# Patient Record
Sex: Female | Born: 1935 | Race: White | Hispanic: No | Marital: Married | Smoking: Never smoker
Health system: Southern US, Community
[De-identification: ages and names within clinical notes are randomized; demographics above are authoritative.]

## PROBLEM LIST (undated history)

## (undated) DIAGNOSIS — M1990 Unspecified osteoarthritis, unspecified site: Secondary | ICD-10-CM

## (undated) DIAGNOSIS — H269 Unspecified cataract: Secondary | ICD-10-CM

## (undated) DIAGNOSIS — G709 Myoneural disorder, unspecified: Secondary | ICD-10-CM

## (undated) DIAGNOSIS — Z5189 Encounter for other specified aftercare: Secondary | ICD-10-CM

## (undated) DIAGNOSIS — I10 Essential (primary) hypertension: Secondary | ICD-10-CM

## (undated) HISTORY — PX: CATARACT EXTRACTION: SUR2

## (undated) HISTORY — DX: Myoneural disorder, unspecified: G70.9

## (undated) HISTORY — PX: ABDOMINAL HYSTERECTOMY: SHX81

## (undated) HISTORY — DX: Essential (primary) hypertension: I10

## (undated) HISTORY — DX: Unspecified cataract: H26.9

## (undated) HISTORY — DX: Encounter for other specified aftercare: Z51.89

## (undated) HISTORY — PX: APPENDECTOMY: SHX54

## (undated) HISTORY — DX: Unspecified osteoarthritis, unspecified site: M19.90

## (undated) HISTORY — PX: EYE SURGERY: SHX253

## (undated) HISTORY — PX: JOINT REPLACEMENT: SHX530

---

## 1960-12-24 HISTORY — PX: OTHER SURGICAL HISTORY: SHX169

## 1988-12-24 HISTORY — PX: VAGINAL HYSTERECTOMY: SHX2639

## 2004-11-28 ENCOUNTER — Ambulatory Visit (HOSPITAL_COMMUNITY): Admission: RE | Admit: 2004-11-28 | Discharge: 2004-11-28 | Payer: Self-pay | Admitting: Family Medicine

## 2004-12-05 ENCOUNTER — Ambulatory Visit (HOSPITAL_COMMUNITY): Admission: RE | Admit: 2004-12-05 | Discharge: 2004-12-05 | Payer: Self-pay | Admitting: Internal Medicine

## 2004-12-05 ENCOUNTER — Ambulatory Visit: Payer: Self-pay | Admitting: Internal Medicine

## 2005-04-06 ENCOUNTER — Ambulatory Visit (HOSPITAL_COMMUNITY): Admission: RE | Admit: 2005-04-06 | Discharge: 2005-04-06 | Payer: Self-pay | Admitting: Family Medicine

## 2008-03-02 ENCOUNTER — Ambulatory Visit (HOSPITAL_COMMUNITY): Admission: RE | Admit: 2008-03-02 | Discharge: 2008-03-02 | Payer: Self-pay | Admitting: Family Medicine

## 2008-03-18 ENCOUNTER — Ambulatory Visit (HOSPITAL_COMMUNITY): Admission: RE | Admit: 2008-03-18 | Discharge: 2008-03-18 | Payer: Self-pay | Admitting: Family Medicine

## 2008-04-12 ENCOUNTER — Ambulatory Visit (HOSPITAL_COMMUNITY): Admission: RE | Admit: 2008-04-12 | Discharge: 2008-04-12 | Payer: Self-pay | Admitting: Family Medicine

## 2008-09-10 ENCOUNTER — Ambulatory Visit (HOSPITAL_COMMUNITY): Admission: RE | Admit: 2008-09-10 | Discharge: 2008-09-10 | Payer: Self-pay | Admitting: Family Medicine

## 2010-05-24 HISTORY — PX: REPLACEMENT TOTAL KNEE: SUR1224

## 2010-06-22 ENCOUNTER — Inpatient Hospital Stay (HOSPITAL_COMMUNITY): Admission: RE | Admit: 2010-06-22 | Discharge: 2010-06-25 | Payer: Self-pay | Admitting: Orthopedic Surgery

## 2011-01-24 HISTORY — PX: BASAL CELL CARCINOMA EXCISION: SHX1214

## 2011-03-11 LAB — CROSSMATCH
ABO/RH(D): O POS
Antibody Screen: NEGATIVE

## 2011-03-11 LAB — CBC
HCT: 20.9 % — ABNORMAL LOW (ref 36.0–46.0)
HCT: 23.6 % — ABNORMAL LOW (ref 36.0–46.0)
HCT: 27.8 % — ABNORMAL LOW (ref 36.0–46.0)
HCT: 37.3 % (ref 36.0–46.0)
Hemoglobin: 7.2 g/dL — ABNORMAL LOW (ref 12.0–15.0)
Hemoglobin: 8.1 g/dL — ABNORMAL LOW (ref 12.0–15.0)
Hemoglobin: 9.4 g/dL — ABNORMAL LOW (ref 12.0–15.0)
Hemoglobin: 12.2 g/dL (ref 12.0–15.0)
MCH: 32.1 pg (ref 26.0–34.0)
MCH: 32.2 pg (ref 26.0–34.0)
MCH: 32.2 pg (ref 26.0–34.0)
MCH: 31.3 pg (ref 26.0–34.0)
MCHC: 33.9 g/dL (ref 30.0–36.0)
MCHC: 34.2 g/dL (ref 30.0–36.0)
MCHC: 34.4 g/dL (ref 30.0–36.0)
MCHC: 32.7 g/dL (ref 30.0–36.0)
MCV: 93.7 fL (ref 78.0–100.0)
MCV: 93.9 fL (ref 78.0–100.0)
MCV: 95 fL (ref 78.0–100.0)
MCV: 95.8 fL (ref 78.0–100.0)
Platelets: 198 10*3/uL (ref 150–400)
Platelets: 202 10*3/uL (ref 150–400)
Platelets: 240 10*3/uL (ref 150–400)
Platelets: 280 10*3/uL (ref 150–400)
RBC: 2.23 MIL/uL — ABNORMAL LOW (ref 3.87–5.11)
RBC: 2.51 MIL/uL — ABNORMAL LOW (ref 3.87–5.11)
RBC: 2.93 MIL/uL — ABNORMAL LOW (ref 3.87–5.11)
RBC: 3.89 MIL/uL (ref 3.87–5.11)
RDW: 13.1 % (ref 11.5–15.5)
RDW: 13.3 % (ref 11.5–15.5)
RDW: 13.5 % (ref 11.5–15.5)
RDW: 13.4 % (ref 11.5–15.5)
WBC: 6.3 10*3/uL (ref 4.0–10.5)
WBC: 7.6 10*3/uL (ref 4.0–10.5)
WBC: 8 10*3/uL (ref 4.0–10.5)
WBC: 5.4 10*3/uL (ref 4.0–10.5)

## 2011-03-11 LAB — URINALYSIS, ROUTINE W REFLEX MICROSCOPIC
Bilirubin Urine: NEGATIVE
Glucose, UA: NEGATIVE mg/dL
Hgb urine dipstick: NEGATIVE
Ketones, ur: NEGATIVE mg/dL
Nitrite: NEGATIVE
Protein, ur: NEGATIVE mg/dL
Specific Gravity, Urine: 1.016 (ref 1.005–1.030)
Urobilinogen, UA: 0.2 mg/dL (ref 0.0–1.0)
pH: 7 (ref 5.0–8.0)

## 2011-03-11 LAB — COMPREHENSIVE METABOLIC PANEL
ALT: 22 U/L (ref 0–35)
AST: 26 U/L (ref 0–37)
Albumin: 4.1 g/dL (ref 3.5–5.2)
Alkaline Phosphatase: 52 U/L (ref 39–117)
BUN: 18 mg/dL (ref 6–23)
CO2: 29 mEq/L (ref 19–32)
Calcium: 9.7 mg/dL (ref 8.4–10.5)
Chloride: 107 mEq/L (ref 96–112)
Creatinine, Ser: 0.88 mg/dL (ref 0.4–1.2)
GFR calc Af Amer: >60 mL/min (ref >60)
GFR calc non Af Amer: >60 mL/min (ref >60)
Glucose, Bld: 81 mg/dL (ref 70–99)
Potassium: 4.2 mEq/L (ref 3.5–5.1)
Sodium: 143 mEq/L (ref 135–145)
Total Bilirubin: 0.7 mg/dL (ref 0.3–1.2)
Total Protein: 7.4 g/dL (ref 6.0–8.3)

## 2011-03-11 LAB — BASIC METABOLIC PANEL
BUN: 15 mg/dL (ref 6–23)
BUN: 18 mg/dL (ref 6–23)
CO2: 28 mEq/L (ref 19–32)
CO2: 29 mEq/L (ref 19–32)
Calcium: 8.5 mg/dL (ref 8.4–10.5)
Calcium: 8.8 mg/dL (ref 8.4–10.5)
Chloride: 103 mEq/L (ref 96–112)
Chloride: 105 mEq/L (ref 96–112)
Creatinine, Ser: 0.77 mg/dL (ref 0.4–1.2)
Creatinine, Ser: 0.79 mg/dL (ref 0.4–1.2)
GFR calc Af Amer: >60 mL/min (ref >60)
GFR calc Af Amer: >60 mL/min (ref >60)
GFR calc non Af Amer: >60 mL/min (ref >60)
GFR calc non Af Amer: >60 mL/min (ref >60)
Glucose, Bld: 105 mg/dL — ABNORMAL HIGH (ref 70–99)
Glucose, Bld: 150 mg/dL — ABNORMAL HIGH (ref 70–99)
Potassium: 3.4 mEq/L — ABNORMAL LOW (ref 3.5–5.1)
Potassium: 4.6 mEq/L (ref 3.5–5.1)
Sodium: 137 mEq/L (ref 135–145)
Sodium: 137 mEq/L (ref 135–145)

## 2011-03-11 LAB — SURGICAL PCR SCREEN
MRSA, PCR: NEGATIVE
Staphylococcus aureus: NEGATIVE

## 2011-03-11 LAB — APTT: aPTT: 28 seconds (ref 24–37)

## 2011-03-11 LAB — DIFFERENTIAL
Basophils Absolute: 0 10*3/uL (ref 0.0–0.1)
Basophils Relative: 1 % (ref 0–1)
Eosinophils Absolute: 0.1 10*3/uL (ref 0.0–0.7)
Eosinophils Relative: 3 % (ref 0–5)
Lymphocytes Relative: 34 % (ref 12–46)
Lymphs Abs: 1.8 10*3/uL (ref 0.7–4.0)
Monocytes Absolute: 0.3 10*3/uL (ref 0.1–1.0)
Monocytes Relative: 6 % (ref 3–12)
Neutro Abs: 3.1 10*3/uL (ref 1.7–7.7)
Neutrophils Relative %: 57 % (ref 43–77)

## 2011-03-11 LAB — PROTIME-INR
INR: 1.02 (ref 0.00–1.49)
Prothrombin Time: 13.3 seconds (ref 11.6–15.2)

## 2011-03-11 LAB — ABO/RH: ABO/RH(D): O POS

## 2011-05-11 NOTE — Op Note (Signed)
Kristi Grant, Kristi Grant                  ACCOUNT NO.:  [id number]   MEDICAL RECORD NO.:  [id number]          PATIENT TYPE:  AMB   LOCATION:  DAY                           FACILITY:  [facility]   PHYSICIAN:  R. Roetta Sessions, M.D. DATE OF BIRTH:  09-04-1934   DATE OF PROCEDURE:  12/05/2004  DATE OF DISCHARGE:                                 OPERATIVE REPORT   PROCEDURE:  Screening colonoscopy.   INDICATION FOR PROCEDURE:  The patient is a 77 year old lady referred out of  courtesy of Dr. Gerda Diss for colorectal cancer screening.  She had a flexible  sigmoidoscopy which was negative five years ago.  She is devoid of any lower  GI tract symptoms.  There is no family history of colorectal neoplasia.  Colonoscopy is now being done as a standard screening maneuver.  This  approach has been discussed with the patient at length.  Potential risks,  benefits, and alternatives have been reviewed, questions answered, she is  agreeable.  Please see documentation in the medical record.   PROCEDURE NOTE:  O2 saturation, blood pressure, pulse, and respirations were  monitored throughout the entire procedure.   CONSCIOUS SEDATION:  Versed 4 mg IV, Demerol 75 mg IV in divided doses.   INSTRUMENT USED:  Olympus video chip system.   FINDINGS:  Digital exam revealed no abnormalities.  Endoscopic findings:  Prep was good.   Rectum:  Examination of the rectal mucosa, including retroflexed view of the  anal verge, revealed no abnormalities.   Colon:  The colonic mucosa was surveyed from the rectosigmoid junction  through the left, transverse, and right colon to the area of the appendiceal  orifice, ileocecal valve, and cecum.  These structures were well-seen and  photographed for the record.  From this level the scope was slowly withdrawn  and all previously-mentioned mucosal surfaces were again seen.  The  patient's colon was quite long and tortuous, requiring a number of  maneuvers, including changing of the  patient's position and external  abdominal pressure to reach the cecum.  The colonic mucosa, however,  appeared entirely normal.  The patient tolerated the procedure well and was  reacted in endoscopy.   IMPRESSION:  1.  Normal rectum.  2.  Normal colon.   RECOMMENDATIONS:  Repeat colonoscopy 10 years.     Otelia Sergeant   RMR/MEDQ  D:  12/05/2004  T:  12/05/2004  Job:  [id number]   cc:   Dr. Gerda Diss

## 2013-06-25 ENCOUNTER — Ambulatory Visit (INDEPENDENT_AMBULATORY_CARE_PROVIDER_SITE_OTHER): Payer: 59 | Admitting: Nurse Practitioner

## 2013-06-25 ENCOUNTER — Encounter: Payer: Self-pay | Admitting: Nurse Practitioner

## 2013-06-25 VITALS — BP 120/70 | Temp 98.3°F | Wt 196.5 lb

## 2013-06-25 DIAGNOSIS — N390 Urinary tract infection, site not specified: Secondary | ICD-10-CM

## 2013-06-25 LAB — POCT UA - MICROSCOPIC ONLY

## 2013-06-25 LAB — POCT URINALYSIS DIPSTICK: Spec Grav, UA: <=1.005

## 2013-06-25 MED ORDER — CEFUROXIME AXETIL 500 MG PO TABS: 500.0000 mg | ORAL_TABLET | Freq: Two times a day (BID) | ORAL | Status: DC

## 2013-06-28 ENCOUNTER — Encounter: Payer: Self-pay | Admitting: Nurse Practitioner

## 2013-06-28 DIAGNOSIS — N390 Urinary tract infection, site not specified: Secondary | ICD-10-CM | POA: Insufficient documentation

## 2013-06-28 NOTE — Assessment & Plan Note (Signed)
Ceftin 500 mg 1 by mouth twice a day x7 days. Continue AZO for 48 hours then discontinue. Warning signs reviewed. Call back if symptoms worsen or persist.

## 2013-06-28 NOTE — Progress Notes (Signed)
Subjective:  Presents complaints of urinary symptoms for the past 4 days. Low-grade fever. Mild lower, pain. Bladder spasms of the end of urination. Urgency and frequency. Over 2 her urine. No nausea vomiting. No back pain. Taking fluids well. No history of recent UTI. Married, same sexual partner. No vaginal discharge.  Objective:   BP 120/70  Temp(Src) 98.3 F (36.8 C) (Oral)  Wt 196 lb 8 oz (89.132 kg) NAD. Alert, oriented. Lungs clear. Heart regular rate rhythm. No CVA area tenderness. Abdomen soft nondistended with mild suprapubic area discomfort to palpation. Urine microscopic 5-10 WBCs, rare RBC and few bacteria.  Assessment:UTI (urinary tract infection) - Plan: POCT urinalysis dipstick, POCT UA - Microscopic Only  Plan: Ceftin 500 mg 1 by mouth twice a day x7 days. Continue AZO for 48 hours then discontinue. Warning signs reviewed. Call back if symptoms worsen or persist.

## 2013-09-01 ENCOUNTER — Encounter: Payer: Self-pay | Admitting: Family Medicine

## 2013-09-01 ENCOUNTER — Ambulatory Visit (INDEPENDENT_AMBULATORY_CARE_PROVIDER_SITE_OTHER): Payer: 59 | Admitting: Family Medicine

## 2013-09-01 VITALS — BP 130/70 | Temp 98.3°F | Ht 66.75 in | Wt 201.0 lb

## 2013-09-01 DIAGNOSIS — E785 Hyperlipidemia, unspecified: Secondary | ICD-10-CM

## 2013-09-01 DIAGNOSIS — I10 Essential (primary) hypertension: Secondary | ICD-10-CM

## 2013-09-01 DIAGNOSIS — J209 Acute bronchitis, unspecified: Secondary | ICD-10-CM

## 2013-09-01 MED ORDER — ALBUTEROL SULFATE HFA 108 (90 BASE) MCG/ACT IN AERS: 2.0000 | INHALATION_SPRAY | Freq: Four times a day (QID) | RESPIRATORY_TRACT | Status: DC | PRN

## 2013-09-01 MED ORDER — AMOXICILLIN 500 MG PO TABS: 500.0000 mg | ORAL_TABLET | Freq: Three times a day (TID) | ORAL | Status: AC

## 2013-09-01 NOTE — Progress Notes (Signed)
  Subjective:    Patient ID: Kristi Grant, female    DOB: 1934-07-19, 79 y.o.   MRN: [id number]  HPI  Patient arrives for cough and head congestion for over a week.  Review of Systems  Constitutional: Negative for fever and activity change.  HENT: Positive for congestion and rhinorrhea. Negative for ear pain.   Eyes: Negative for discharge.  Respiratory: Positive for cough. Negative for shortness of breath and wheezing.   Cardiovascular: Negative for chest pain.       Objective:   Physical Exam  Nursing note and vitals reviewed. Constitutional: She appears well-developed.  HENT:  Head: Normocephalic.  Nose: Nose normal.  Mouth/Throat: Oropharynx is clear and moist. No oropharyngeal exudate.  Neck: Neck supple.  Cardiovascular: Normal rate and normal heart sounds.   No murmur heard. Pulmonary/Chest: Effort normal and breath sounds normal. She has no wheezes.  Lymphadenopathy:    She has no cervical adenopathy.  Skin: Skin is warm and dry.          Assessment & Plan:  Acute bronchitis Acute sinusitis Rx sent in F/u if problems

## 2013-11-12 ENCOUNTER — Encounter: Payer: Self-pay | Admitting: Family Medicine

## 2013-12-09 ENCOUNTER — Telehealth: Payer: Self-pay | Admitting: Family Medicine

## 2013-12-09 DIAGNOSIS — Z79899 Other long term (current) drug therapy: Secondary | ICD-10-CM

## 2013-12-09 DIAGNOSIS — E782 Mixed hyperlipidemia: Secondary | ICD-10-CM

## 2013-12-09 NOTE — Telephone Encounter (Signed)
bw orders for PE on 12/29/12

## 2013-12-09 NOTE — Telephone Encounter (Signed)
Blood work ordered in [employer]. Notified husband that patient can report to lab to have blood drawn.

## 2013-12-22 LAB — BASIC METABOLIC PANEL
BUN: 18 mg/dL (ref 6–23)
Potassium: 4.2 mEq/L (ref 3.5–5.3)
Sodium: 141 mEq/L (ref 135–145)

## 2013-12-22 LAB — HEPATIC FUNCTION PANEL
AST: 20 U/L (ref 0–37)
Bilirubin, Direct: 0.1 mg/dL (ref 0.0–0.3)
Indirect Bilirubin: 0.4 mg/dL (ref 0.0–0.9)
Total Bilirubin: 0.5 mg/dL (ref 0.3–1.2)

## 2013-12-22 LAB — LIPID PANEL
Cholesterol: 194 mg/dL (ref 0–200)
LDL Cholesterol: 122 mg/dL — ABNORMAL HIGH (ref 0–99)
Total CHOL/HDL Ratio: 4.1 Ratio
VLDL: 25 mg/dL (ref 0–40)

## 2013-12-29 ENCOUNTER — Encounter: Payer: Self-pay | Admitting: Family Medicine

## 2013-12-29 ENCOUNTER — Ambulatory Visit (INDEPENDENT_AMBULATORY_CARE_PROVIDER_SITE_OTHER): Payer: 59 | Admitting: Family Medicine

## 2013-12-29 VITALS — BP 132/78 | HR 80 | Ht 66.75 in | Wt 197.0 lb

## 2013-12-29 DIAGNOSIS — M8580 Other specified disorders of bone density and structure, unspecified site: Secondary | ICD-10-CM

## 2013-12-29 DIAGNOSIS — M949 Disorder of cartilage, unspecified: Secondary | ICD-10-CM

## 2013-12-29 DIAGNOSIS — M899 Disorder of bone, unspecified: Secondary | ICD-10-CM

## 2013-12-29 DIAGNOSIS — Z0000 Encounter for general adult medical examination without abnormal findings: Secondary | ICD-10-CM

## 2013-12-29 MED ORDER — METOPROLOL TARTRATE 100 MG PO TABS: 100.0000 mg | ORAL_TABLET | Freq: Every day | ORAL | Status: DC

## 2013-12-29 MED ORDER — TRIAMTERENE-HCTZ 37.5-25 MG PO CAPS
1.0000 | ORAL_CAPSULE | ORAL | Status: DC
Start: 2013-12-29 — End: 2015-01-17

## 2013-12-29 MED ORDER — PRAVASTATIN SODIUM 20 MG PO TABS: 20.0000 mg | ORAL_TABLET | Freq: Every day | ORAL | Status: DC

## 2013-12-29 NOTE — Progress Notes (Signed)
Subjective:    Patient ID: Kristi Grant, female    DOB: [birth year], 80 y.o.   MRN: [id number]  HPI AWV- Annual Wellness Visit  The patient was seen for their annual wellness visit. The patient's past medical history, surgical history, and family history were reviewed. Pertinent vaccines were reviewed ( tetanus, pneumonia, shingles, flu) The patient's medication list was reviewed and updated. The height and weight were entered. The patient's current BMI is: 31.09 waist measurement is 41 inches.   Cognitive screening was completed. Outcome of Mini - WUJ:WJXB  Falls within the past 6 months:None Current tobacco usage:NOne (All patients who use tobacco were given written and verbal information on quitting)  Recent listing of emergency department/hospitalizations over the past year were reviewed.  current specialist the patient sees on a regular basis:   Medicare annual wellness visit patient questionnaire was reviewed.  A written screening schedule for the patient for the next 5-10 years was given. Appropriate discussion of followup regarding next visit was discussed.       Review of Systems  Constitutional: Negative for activity change, appetite change and fatigue.  HENT: Negative for congestion, ear discharge and rhinorrhea.   Eyes: Negative for discharge.  Respiratory: Negative for cough, chest tightness and wheezing.   Cardiovascular: Negative for chest pain.  Gastrointestinal: Negative for vomiting and abdominal pain.  Genitourinary: Negative for frequency and difficulty urinating.  Musculoskeletal: Negative for neck pain.  Allergic/Immunologic: Negative for environmental allergies and food allergies.  Neurological: Negative for weakness and headaches.  Psychiatric/Behavioral: Negative for behavioral problems and agitation.       Objective:   Physical Exam  Constitutional: She is oriented to person, place, and time. She appears well-developed and well-nourished.    HENT:  Head: Normocephalic.  Right Ear: External ear normal.  Left Ear: External ear normal.  Neck: Normal range of motion. No thyromegaly present.  Cardiovascular: Normal rate, regular rhythm, normal heart sounds and intact distal pulses.   No murmur heard. Pulmonary/Chest: Effort normal and breath sounds normal. No respiratory distress. She has no wheezes.  Abdominal: Soft. Bowel sounds are normal. She exhibits no distension and no mass. There is no tenderness.  Musculoskeletal: Normal range of motion. She exhibits no edema and no tenderness.  Lymphadenopathy:    She has no cervical adenopathy.  Neurological: She is alert and oriented to person, place, and time. She exhibits normal muscle tone.  Skin: Skin is warm and dry.  Psychiatric: She has a normal mood and affect. Her behavior is normal.          Assessment & Plan:  Thank you for coming for your annual wellness visit.  Please follow through on any advice that was given to you by today's visit. Remember to maintain compliance with your medications as discussed today.  Also remember it is important to eat a healthy diet and to stay physically active on a daily basis.  Please follow through with any testing or recommended followup office visits as was discussed today. You are due the following test coming up:  ALL- Colonoscopy-due,pt will call Dr Kendell Bane office           Vaccines-UTD   Females- Mammogram-UTD        Breast Exam-per gyn        Pelvic Exam-per gyn        Bone Density_UTD     Finally remembered that the annual wellness visit does not take the place of regularly scheduled office visits  chronic  health problems such as hypertension/diabetes/cholesterol visits.

## 2014-01-05 ENCOUNTER — Ambulatory Visit (HOSPITAL_COMMUNITY)
Admission: RE | Admit: 2014-01-05 | Discharge: 2014-01-05 | Disposition: A | Discharge status: Home / Self Care | Payer: Medicare Other | Source: Ambulatory Visit | Attending: Family Medicine | Admitting: Family Medicine

## 2014-01-05 ENCOUNTER — Encounter: Payer: Self-pay | Admitting: Family Medicine

## 2014-01-05 DIAGNOSIS — Z9079 Acquired absence of other genital organ(s): Secondary | ICD-10-CM | POA: Insufficient documentation

## 2014-01-05 DIAGNOSIS — M899 Disorder of bone, unspecified: Secondary | ICD-10-CM | POA: Insufficient documentation

## 2014-01-05 DIAGNOSIS — Z13820 Encounter for screening for osteoporosis: Secondary | ICD-10-CM | POA: Insufficient documentation

## 2014-01-05 DIAGNOSIS — M949 Disorder of cartilage, unspecified: Secondary | ICD-10-CM

## 2014-01-05 DIAGNOSIS — Z780 Asymptomatic menopausal state: Secondary | ICD-10-CM | POA: Insufficient documentation

## 2014-01-05 DIAGNOSIS — M8580 Other specified disorders of bone density and structure, unspecified site: Secondary | ICD-10-CM

## 2014-07-01 ENCOUNTER — Encounter: Payer: Self-pay | Admitting: Family Medicine

## 2014-07-01 ENCOUNTER — Ambulatory Visit (INDEPENDENT_AMBULATORY_CARE_PROVIDER_SITE_OTHER): Payer: 59 | Admitting: Family Medicine

## 2014-07-01 VITALS — BP 122/76 | Ht 66.75 in | Wt 195.1 lb

## 2014-07-01 DIAGNOSIS — Z23 Encounter for immunization: Secondary | ICD-10-CM

## 2014-07-01 DIAGNOSIS — E785 Hyperlipidemia, unspecified: Secondary | ICD-10-CM

## 2014-07-01 DIAGNOSIS — I10 Essential (primary) hypertension: Secondary | ICD-10-CM

## 2014-07-01 NOTE — Progress Notes (Signed)
   Subjective:    Patient ID: Kristi Grant, female    DOB: [birth year], 80 y.o.   MRN: [id number]  Hypertension This is a chronic problem. The current episode started more than 1 year ago. The problem has been gradually improving since onset. The problem is controlled. Pertinent negatives include no chest pain. There are no associated agents to hypertension. There are no known risk factors for coronary artery disease. Treatments tried: metoprolol, triamterene-HCTZ. The current treatment provides significant improvement. There are no compliance problems.    Patient would like to have a breast exam done today. Patient states she is not having any problems. Mammogram was done in November 2014 and the results was normal but she was told her breast tissue is dense. She just wants a regular exam done to keep check on this.    Review of Systems  Constitutional: Negative for activity change, appetite change and fatigue.  HENT: Negative for congestion.   Respiratory: Negative for cough and choking.   Cardiovascular: Negative for chest pain.  Endocrine: Negative for polydipsia and polyphagia.  Genitourinary: Negative for frequency.  Neurological: Negative for weakness.  Psychiatric/Behavioral: Negative for confusion.       Objective:   Physical Exam  Vitals reviewed. Constitutional: She appears well-nourished. No distress.  Cardiovascular: Normal rate, regular rhythm and normal heart sounds.   No murmur heard. Pulmonary/Chest: Effort normal and breath sounds normal. No respiratory distress.  Musculoskeletal: She exhibits no edema.  Lymphadenopathy:    She has no cervical adenopathy.  Neurological: She is alert. She exhibits normal muscle tone.  Psychiatric: Her behavior is normal.     Breast exam normal.     Assessment & Plan:  HTN low salt diet regular exercise continue medications. Followup in 6 months for comprehensive checkup and lab work  Patient under a lot of stress handling it  well  Her mammogram was reviewed she does have dense breast bud on today's exam her breast exam is normal. MRI might enhance the ability to look at this area but her insurance probably would not pay for this. She does not have family history of breast cancer.

## 2014-10-28 ENCOUNTER — Telehealth: Payer: Self-pay | Admitting: Internal Medicine

## 2014-10-28 NOTE — Telephone Encounter (Signed)
Patient called today to set up her colonoscopy. Please call her at [phone]

## 2014-11-03 ENCOUNTER — Encounter: Payer: Self-pay | Admitting: Internal Medicine

## 2014-11-03 NOTE — Telephone Encounter (Signed)
LMOM to call.

## 2014-11-11 NOTE — Telephone Encounter (Signed)
Letter mailed to pt to call.  

## 2014-11-12 ENCOUNTER — Encounter: Payer: Self-pay | Admitting: Family Medicine

## 2014-12-29 ENCOUNTER — Telehealth: Payer: Self-pay | Admitting: *Deleted

## 2014-12-29 ENCOUNTER — Ambulatory Visit (AMBULATORY_SURGERY_CENTER): Payer: Self-pay | Admitting: *Deleted

## 2014-12-29 VITALS — Ht 67.0 in | Wt 185.0 lb

## 2014-12-29 DIAGNOSIS — Z1211 Encounter for screening for malignant neoplasm of colon: Secondary | ICD-10-CM

## 2014-12-29 MED ORDER — NA SULFATE-K SULFATE-MG SULF 17.5-3.13-1.6 GM/177ML PO SOLN: 1.0000 | Freq: Once | ORAL | Status: DC

## 2014-12-29 NOTE — Telephone Encounter (Signed)
Direct is ok

## 2014-12-29 NOTE — Progress Notes (Signed)
[facility]  Dr. Leone PayorGessner, This pt is 81 y.o. And she is coming in for a screening colonoscopy.  Is she ok for a direct or do you want an OV first (d/t age)?  Thanks,  [employer] ResourcesKristen

## 2014-12-29 NOTE — Telephone Encounter (Signed)
noted 

## 2014-12-29 NOTE — Telephone Encounter (Signed)
  Dr. Leone PayorGessner, This pt is 81 y.o. And she is coming in for a screening colonoscopy.  Is she ok for a direct or do you want an OV first (d/t age)?  Thanks,  [employer] ResourcesKristen

## 2014-12-30 ENCOUNTER — Ambulatory Visit (INDEPENDENT_AMBULATORY_CARE_PROVIDER_SITE_OTHER): Payer: Medicare Other | Admitting: Otolaryngology

## 2015-01-04 ENCOUNTER — Other Ambulatory Visit: Payer: Self-pay | Admitting: Family Medicine

## 2015-01-06 ENCOUNTER — Encounter: Payer: Self-pay | Admitting: Family Medicine

## 2015-01-06 ENCOUNTER — Ambulatory Visit (INDEPENDENT_AMBULATORY_CARE_PROVIDER_SITE_OTHER): Payer: Medicare Other | Admitting: Family Medicine

## 2015-01-06 VITALS — BP 120/70 | Temp 98.3°F | Ht 66.75 in | Wt 188.0 lb

## 2015-01-06 DIAGNOSIS — N390 Urinary tract infection, site not specified: Secondary | ICD-10-CM

## 2015-01-06 LAB — POCT URINALYSIS DIPSTICK: PH UA: 7

## 2015-01-06 MED ORDER — CIPROFLOXACIN HCL 250 MG PO TABS: 250.0000 mg | ORAL_TABLET | Freq: Two times a day (BID) | ORAL | Status: DC

## 2015-01-06 NOTE — Progress Notes (Signed)
   Subjective:    Patient ID: Kristi Grant, female    DOB: 1934-08-18, 81 y.o.   MRN: [id number]  Urinary Tract Infection  This is a new problem. The current episode started yesterday. The problem occurs intermittently. The problem has been unchanged. The quality of the pain is described as aching. The pain is moderate. There has been no fever. Associated symptoms comments: Abdominal pain. She has tried increased fluids for the symptoms. The treatment provided no relief.   Patient states that she has no other concerns at this time.   low abd disc no fevr  Results for orders placed or performed in visit on 01/06/15  POCT urinalysis dipstick  Result Value Ref Range   Color, UA     Clarity, UA     Glucose, UA     Bilirubin, UA     Ketones, UA     Spec Grav, UA <=1.005    Blood, UA     pH, UA 7.0    Protein, UA     Urobilinogen, UA     Nitrite, UA     Leukocytes, UA Trace    No nausea  Two sig bm's    Sig low abd disciomfor t  te bfast, hungry  Did not take tylenol  Review of Systems    no vomiting no diarrhea no chills no rash ROS otherwise negative Objective:   Physical Exam  Alert no acute distress. Vitals stable. H&T normal. Lungs clear heart rare rhythm low abdomen tender to palpation.  Urinalysis 2-4 white blood cells per high-power field    Assessment & Plan:  Impression urinary tract infection discussed plan antibiotics prescribed. Since Medicare discussed. WSL

## 2015-01-07 NOTE — Addendum Note (Signed)
Addended by: MCCRAW, ELIZABETH W on: 01/07/2015 07:15 AM   Modules accepted: Level of Service  

## 2015-01-12 ENCOUNTER — Encounter: Payer: Medicare Other | Admitting: Internal Medicine

## 2015-01-17 ENCOUNTER — Ambulatory Visit (INDEPENDENT_AMBULATORY_CARE_PROVIDER_SITE_OTHER): Payer: Medicare Other | Admitting: Family Medicine

## 2015-01-17 ENCOUNTER — Encounter: Payer: Self-pay | Admitting: Family Medicine

## 2015-01-17 VITALS — BP 110/74 | Ht 66.5 in | Wt 185.0 lb

## 2015-01-17 DIAGNOSIS — I951 Orthostatic hypotension: Secondary | ICD-10-CM

## 2015-01-17 DIAGNOSIS — R5383 Other fatigue: Secondary | ICD-10-CM

## 2015-01-17 DIAGNOSIS — Z0000 Encounter for general adult medical examination without abnormal findings: Secondary | ICD-10-CM

## 2015-01-17 DIAGNOSIS — E785 Hyperlipidemia, unspecified: Secondary | ICD-10-CM

## 2015-01-17 DIAGNOSIS — N390 Urinary tract infection, site not specified: Secondary | ICD-10-CM

## 2015-01-17 DIAGNOSIS — I10 Essential (primary) hypertension: Secondary | ICD-10-CM

## 2015-01-17 LAB — POCT URINALYSIS DIPSTICK: pH, UA: 7

## 2015-01-17 MED ORDER — PRAVASTATIN SODIUM 20 MG PO TABS: 20.0000 mg | ORAL_TABLET | Freq: Every day | ORAL | Status: DC

## 2015-01-17 MED ORDER — HYDROCHLOROTHIAZIDE 25 MG PO TABS: 25.0000 mg | ORAL_TABLET | Freq: Every day | ORAL | Status: DC

## 2015-01-17 MED ORDER — METOPROLOL TARTRATE 100 MG PO TABS: 100.0000 mg | ORAL_TABLET | Freq: Every day | ORAL | Status: DC

## 2015-01-17 MED ORDER — POTASSIUM CHLORIDE ER 10 MEQ PO TBCR: 10.0000 meq | EXTENDED_RELEASE_TABLET | Freq: Every day | ORAL | Status: DC

## 2015-01-17 NOTE — Progress Notes (Signed)
Subjective:    Patient ID: Kristi Grant, female    DOB: 01/25/1934, 81 y.o.   MRN: [id number]  HPI AWV- Annual Wellness Visit  The patient was seen for their annual wellness visit. The patient's past medical history, surgical history, and family history were reviewed. Pertinent vaccines were reviewed ( tetanus, pneumonia, shingles, flu) The patient's medication list was reviewed and updated.  The height and weight were entered. The patient's current BMI is: 29.41  Cognitive screening was completed. Outcome of Mini - Cog: pass  Falls within the past 6 months:none  Current tobacco usage: none (All patients who use tobacco were given written and verbal information on quitting)  Recent listing of emergency department/hospitalizations over the past year were reviewed.  current specialist the patient sees on a regular basis: eye doctor and audiologist  Pt states she has a colonoscopy scheduled in February. She states she had a breast exam with in the past year and does not want a breast exam or pelvic exam at this time.  Medicare annual wellness visit patient questionnaire was reviewed.  A written screening schedule for the patient for the next 5-10 years was given. Appropriate discussion of followup regarding next visit was discussed. Pt had UTI at last visit. Finished cipro and pt states no symptoms now but would like her urine checked to make sure no infection.    We did check a urinalysis it was negative Patient was counseled regarding healthy diet regular physical activity Patient under a fair amount of stress because of her husbands illness, she denies any depression She is able to perform ADLs. No cognitive dysfunction  Review of Systems  Constitutional: Negative for activity change, appetite change and fatigue.  HENT: Negative for congestion, ear discharge and rhinorrhea.   Eyes: Negative for discharge.  Respiratory: Negative for cough, chest tightness and wheezing.     Cardiovascular: Negative for chest pain.  Gastrointestinal: Negative for vomiting and abdominal pain.  Genitourinary: Negative for frequency and difficulty urinating.  Musculoskeletal: Negative for neck pain.  Allergic/Immunologic: Negative for environmental allergies and food allergies.  Neurological: Negative for weakness and headaches.  Psychiatric/Behavioral: Negative for behavioral problems and agitation.       Objective:   Physical Exam  Constitutional: She is oriented to person, place, and time. She appears well-developed and well-nourished.  HENT:  Head: Normocephalic.  Right Ear: External ear normal.  Left Ear: External ear normal.  Eyes: Right eye exhibits no discharge. Left eye exhibits no discharge. No scleral icterus.  Neck: Normal range of motion. No thyromegaly present.  Cardiovascular: Normal rate, regular rhythm, normal heart sounds and intact distal pulses.   No murmur heard. Pulmonary/Chest: Effort normal and breath sounds normal. No respiratory distress. She has no wheezes.  Abdominal: Soft. Bowel sounds are normal. She exhibits no distension and no mass. There is no tenderness.  Genitourinary:  Patient defers on breast and gynecologic exam  Musculoskeletal: Normal range of motion. She exhibits no edema or tenderness.  Lymphadenopathy:    She has no cervical adenopathy.  Neurological: She is alert and oriented to person, place, and time. She exhibits normal muscle tone.  Skin: Skin is warm and dry.  Psychiatric: She has a normal mood and affect. Her behavior is normal.          Assessment & Plan:  Patient will be getting colonoscopy soon Patient was seen for wellness exam but also had pressing items that were covered today Lab work ordered Hyperlipidemia recheck in 6  months Pre-syncope with postural hypotension-change blood pressure medication hydrochlorothiazide along with 10 mEq of potassium  Follow-up patient in the spring time will recheck  postural hypotension at that time patient will notify us if not any better over the next week

## 2015-01-21 LAB — LIPID PANEL
CHOLESTEROL: 179 mg/dL (ref 0–200)
HDL: 40 mg/dL (ref >39)
LDL Cholesterol: 115 mg/dL — ABNORMAL HIGH (ref 0–99)
Total CHOL/HDL Ratio: 4.5 Ratio
Triglycerides: 119 mg/dL (ref <150)
VLDL: 24 mg/dL (ref 0–40)

## 2015-01-21 LAB — BASIC METABOLIC PANEL
BUN: 16 mg/dL (ref 6–23)
CO2: 30 meq/L (ref 19–32)
Calcium: 9.4 mg/dL (ref 8.4–10.5)
Chloride: 104 mEq/L (ref 96–112)
Creat: 0.93 mg/dL (ref 0.50–1.10)
GLUCOSE: 82 mg/dL (ref 70–99)
Potassium: 4 mEq/L (ref 3.5–5.3)
SODIUM: 142 meq/L (ref 135–145)

## 2015-01-22 LAB — CBC WITH DIFFERENTIAL/PLATELET
BASOS PCT: 1 % (ref 0–1)
Basophils Absolute: 0.1 10*3/uL (ref 0.0–0.1)
Eosinophils Absolute: 0.2 10*3/uL (ref 0.0–0.7)
Eosinophils Relative: 3 % (ref 0–5)
HCT: 36.3 % (ref 36.0–46.0)
Hemoglobin: 11.9 g/dL — ABNORMAL LOW (ref 12.0–15.0)
Lymphocytes Relative: 49 % — ABNORMAL HIGH (ref 12–46)
Lymphs Abs: 2.5 10*3/uL (ref 0.7–4.0)
MCH: 30.8 pg (ref 26.0–34.0)
MCHC: 32.8 g/dL (ref 30.0–36.0)
MCV: 94 fL (ref 78.0–100.0)
MONOS PCT: 6 % (ref 3–12)
MPV: 8.7 fL (ref 8.6–12.4)
Monocytes Absolute: 0.3 10*3/uL (ref 0.1–1.0)
NEUTROS ABS: 2.1 10*3/uL (ref 1.7–7.7)
NEUTROS PCT: 41 % — AB (ref 43–77)
Platelets: 343 10*3/uL (ref 150–400)
RBC: 3.86 MIL/uL — ABNORMAL LOW (ref 3.87–5.11)
RDW: 13.8 % (ref 11.5–15.5)
WBC: 5.1 10*3/uL (ref 4.0–10.5)

## 2015-01-23 ENCOUNTER — Encounter: Payer: Self-pay | Admitting: Family Medicine

## 2015-01-25 ENCOUNTER — Encounter: Payer: Medicare Other | Admitting: Internal Medicine

## 2015-01-28 ENCOUNTER — Ambulatory Visit (AMBULATORY_SURGERY_CENTER): Payer: Medicare Other | Admitting: Internal Medicine

## 2015-01-28 ENCOUNTER — Encounter: Payer: Self-pay | Admitting: Internal Medicine

## 2015-01-28 VITALS — BP 153/77 | HR 54 | Temp 97.7°F | Resp 122 | Ht 67.0 in | Wt 185.0 lb

## 2015-01-28 DIAGNOSIS — Z1211 Encounter for screening for malignant neoplasm of colon: Secondary | ICD-10-CM

## 2015-01-28 MED ORDER — SODIUM CHLORIDE 0.9 % IV SOLN: 500.0000 mL | INTRAVENOUS | Status: DC

## 2015-01-28 NOTE — Op Note (Signed)
[facility] [address] [place], [id number]   COLONOSCOPY PROCEDURE REPORT  PATIENT: Kristi Grant, Kristi P  MR#: [id number] BIRTHDATE: 10-15-1934 , 80  yrs. old GENDER: female ENDOSCOPIST: Iva Booparl E Leiloni Smithers, MD, [facility] PROCEDURE DATE:  01/28/2015 PROCEDURE:   Colonoscopy, screening First Screening Colonoscopy - Avg.  risk and is 50 yrs.  old or older - No.  Prior Negative Screening - Now for repeat screening. 10 or more years since last screening  History of Adenoma - Now for follow-up colonoscopy & has been > or = to 3 yrs.  N/A  Polyps Removed Today? No.  Polyps Removed Today? No.  Recommend repeat exam, <10 yrs? Polyps Removed Today? No.  Recommend repeat exam, <10 yrs? No. ASA CLASS:   Class II INDICATIONS:average risk for colorectal cancer. MEDICATIONS: Propofol 250 mg IV and Monitored anesthesia care  DESCRIPTION OF PROCEDURE:   After the risks benefits and alternatives of the procedure were thoroughly explained, informed consent was obtained.  The digital rectal exam revealed no abnormalities of the rectum.   The LB PCF Q180 [id number]  endoscope was introduced through the anus and advanced to the cecum, which was identified by both the appendix and ileocecal valve. No adverse events experienced.   The quality of the prep was excellent, using MiraLax  The instrument was then slowly withdrawn as the colon was fully examined.      COLON FINDINGS: 1) severe sigmoid diverticulosis with angulation and luminal narrowing. 2) Otherwise normal but tortuous and somewhat redundant colon with moderately difficult scope insertion requiring abdominal pressure.  Retroflexed views revealed no abnormalities. The time to cecum=13 minutes 01 seconds.  Withdrawal time=8 minutes 54 seconds.  The scope was withdrawn and the procedure completed. COMPLICATIONS: There were no immediate complications.  ENDOSCOPIC IMPRESSION: 1) severe sigmoid diverticulosis with angulation and  luminal narrowing. 2) Otherwise normal colon  RECOMMENDATIONS: See GI as needed - does not need repeat colon cancer screening (age)  eSigned:  Iva Booparl E Carys Malina, MD, [facility] 01/28/2015 9:50 AM   cc: Lilyan PuntScott Luking, MD and The Patient

## 2015-01-28 NOTE — Progress Notes (Signed)
Procedure ends, to recovery, report given and VSS. 

## 2015-01-28 NOTE — Patient Instructions (Addendum)
No polyps or cancer seen. You do have a condition called diverticulosis - common and not usually a problem. Please read the handout provided.  No more routine colonoscopy/screening for colon cancer.  I appreciate the opportunity to care for you. Iva Boop, MD, FACG  YOU HAD AN ENDOSCOPIC PROCEDURE TODAY AT THE  ENDOSCOPY CENTER: Refer to the procedure report that was given to you for any specific questions about what was found during the examination.  If the procedure report does not answer your questions, please call your gastroenterologist to clarify.  If you requested that your care partner not be given the details of your procedure findings, then the procedure report has been included in a sealed envelope for you to review at your convenience later.  YOU SHOULD EXPECT: Some feelings of bloating in the abdomen. Passage of more gas than usual.  Walking can help get rid of the air that was put into your GI tract during the procedure and reduce the bloating. If you had a lower endoscopy (such as a colonoscopy or flexible sigmoidoscopy) you may notice spotting of blood in your stool or on the toilet paper. If you underwent a bowel prep for your procedure, then you may not have a normal bowel movement for a few days.  DIET: Your first meal following the procedure should be a light meal and then it is ok to progress to your normal diet.  A half-sandwich or bowl of soup is an example of a good first meal.  Heavy or fried foods are harder to digest and may make you feel nauseous or bloated.  Likewise meals heavy in dairy and vegetables can cause extra gas to form and this can also increase the bloating.  Drink plenty of fluids but you should avoid alcoholic beverages for 24 hours.  ACTIVITY: Your care partner should take you home directly after the procedure.  You should plan to take it easy, moving slowly for the rest of the day.  You can resume normal activity the day after the  procedure however you should NOT DRIVE or use heavy machinery for 24 hours (because of the sedation medicines used during the test).    SYMPTOMS TO REPORT IMMEDIATELY: A gastroenterologist can be reached at any hour.  During normal business hours, 8:30 AM to 5:00 PM Monday through Friday, call [phone].  After hours and on weekends, please call the GI answering service at [phone] who will take a message and have the physician on call contact you.   Following lower endoscopy (colonoscopy or flexible sigmoidoscopy):  Excessive amounts of blood in the stool  Significant tenderness or worsening of abdominal pains  Swelling of the abdomen that is new, acute  Fever of 100F or higher  FOLLOW UP: If any biopsies were taken you will be contacted by phone or by letter within the next 1-3 weeks.  Call your gastroenterologist if you have not heard about the biopsies in 3 weeks.  Our staff will call the home number listed on your records the next business day following your procedure to check on you and address any questions or concerns that you may have at that time regarding the information given to you following your procedure. This is a courtesy call and so if there is no answer at the home number and we have not heard from you through the emergency physician on call, we will assume that you have returned to your regular daily activities without incident.  SIGNATURES/CONFIDENTIALITY:  You and/or your care partner have signed paperwork which will be entered into your electronic medical record.  These signatures attest to the fact that that the information above on your After Visit Summary has been reviewed and is understood.  Full responsibility of the confidentiality of this discharge information lies with you and/or your care-partner.   Information on diverticulosis given to you today

## 2015-01-31 ENCOUNTER — Telehealth: Payer: Self-pay | Admitting: *Deleted

## 2015-01-31 NOTE — Telephone Encounter (Signed)
  Follow up Call-  Call back number 01/28/2015  Post procedure Call Back phone  # [phone]  Permission to leave phone message Yes     Patient questions:  Do you have a fever, pain , or abdominal swelling? No. Pain Score  0 *  Have you tolerated food without any problems? Yes.    Have you been able to return to your normal activities? Yes.    Do you have any questions about your discharge instructions: Diet   No. Medications  No. Follow up visit  No.  Do you have questions or concerns about your Care? No.  Actions: * If pain score is 4 or above: No action needed, pain <4.

## 2015-02-24 ENCOUNTER — Encounter: Payer: Self-pay | Admitting: Family Medicine

## 2015-02-24 ENCOUNTER — Ambulatory Visit (INDEPENDENT_AMBULATORY_CARE_PROVIDER_SITE_OTHER): Payer: Medicare Other | Admitting: Family Medicine

## 2015-02-24 VITALS — BP 110/70 | Temp 98.4°F | Ht 66.5 in | Wt 185.4 lb

## 2015-02-24 DIAGNOSIS — L0390 Cellulitis, unspecified: Secondary | ICD-10-CM | POA: Diagnosis not present

## 2015-02-24 MED ORDER — AMOXICILLIN-POT CLAVULANATE 875-125 MG PO TABS: 1.0000 | ORAL_TABLET | Freq: Two times a day (BID) | ORAL | Status: DC

## 2015-02-24 NOTE — Progress Notes (Signed)
   Subjective:    Patient ID: Kristi LopesMary P Grant, female    DOB: September 12, 1934, 81 y.o.   MRN: [id number]  Toe Pain  The incident occurred 12 to 24 hours ago. The incident occurred at home. There was no injury mechanism. The pain is present in the right foot and right toes. The quality of the pain is described as aching. The pain is moderate. The pain has been constant since onset. She reports no foreign bodies present. Nothing aggravates the symptoms. Treatments tried: epsom salt. The treatment provided no relief.   Patient has no other concerns at this time.    Review of Systems Pain discomfort redness swelling no fever    Objective:   Physical Exam Cellulitis is noted on the second toe on the right side there is a outer aspect on the toe that is consistent with an abrasion from the other toes/toenail. There is some slight redness and to the foot but no abscess noted       Assessment & Plan:  Cellulitis antibodies prescribed warm compresses as needed follow-up if ongoing troubles warning signs discussed I doubt gout.  Blood pressure acceptable patient will monitor as an outpatient and will send us some readings

## 2015-06-23 ENCOUNTER — Ambulatory Visit (INDEPENDENT_AMBULATORY_CARE_PROVIDER_SITE_OTHER): Payer: Medicare Other | Admitting: Nurse Practitioner

## 2015-06-23 ENCOUNTER — Encounter: Payer: Self-pay | Admitting: Nurse Practitioner

## 2015-06-23 VITALS — BP 116/82 | Temp 98.6°F | Wt 186.0 lb

## 2015-06-23 DIAGNOSIS — I10 Essential (primary) hypertension: Secondary | ICD-10-CM | POA: Diagnosis not present

## 2015-06-23 DIAGNOSIS — W57XXXA Bitten or stung by nonvenomous insect and other nonvenomous arthropods, initial encounter: Secondary | ICD-10-CM | POA: Diagnosis not present

## 2015-06-23 DIAGNOSIS — S80862A Insect bite (nonvenomous), left lower leg, initial encounter: Secondary | ICD-10-CM | POA: Diagnosis not present

## 2015-06-26 ENCOUNTER — Encounter: Payer: Self-pay | Admitting: Nurse Practitioner

## 2015-06-26 NOTE — Progress Notes (Signed)
Subjective:  Presents for c/o tick bite posterior left lower leg that occurred on 6/13. Pruritic at times. No fever. No headache or joint pain. Localized rash only. Decreased energy lately. Was taken off Dyazide because of hypotension. Still has some at home. Currently on Metoprolol and HCTZ. No CP/ischemic type pain or SOB. No edema.   Objective:   BP 116/82 mmHg  Temp(Src) 98.6 F (37 C) (Oral)  Wt 186 lb (84.369 kg) NAD. Alert, oriented. Lungs clear. Heart RRR. No murmur or gallop noted. Carotids no bruits or thrills. BP on recheck right arm sitting 190/96 and standing 182/90. Ausculatory gap noted. Lower extremities no edema. Small purplish non raised area at site of tick bite posterior lower left leg. Non tender.   Assessment:  Problem List Items Addressed This Visit      Cardiovascular and Mediastinum   Essential hypertension, benign    Other Visit Diagnoses    Tick bite of lower leg, left, initial encounter    -  Primary      Plan:  Stop HCTZ. Switch back to Dyazide. NV to recheck BP next week. Has appt for follow up with Scott on 8/1. Topical steroid cream to tick bite. Reviewed signs of tick fever.  Return in about 1 week (around 06/30/2015) for nurse visit afternoon 7/8 for BP recheck.

## 2015-06-28 ENCOUNTER — Ambulatory Visit (INDEPENDENT_AMBULATORY_CARE_PROVIDER_SITE_OTHER): Payer: Medicare Other | Admitting: Family Medicine

## 2015-06-28 ENCOUNTER — Encounter: Payer: Self-pay | Admitting: Family Medicine

## 2015-06-28 VITALS — BP 130/80 | Temp 98.6°F | Ht 66.5 in | Wt 184.1 lb

## 2015-06-28 DIAGNOSIS — Z96652 Presence of left artificial knee joint: Secondary | ICD-10-CM

## 2015-06-28 DIAGNOSIS — M5412 Radiculopathy, cervical region: Secondary | ICD-10-CM

## 2015-06-28 MED ORDER — AMOXICILLIN-POT CLAVULANATE 875-125 MG PO TABS: 1.0000 | ORAL_TABLET | Freq: Two times a day (BID) | ORAL | Status: DC

## 2015-06-28 NOTE — Progress Notes (Signed)
   Subjective:    Patient ID: Kristi LopesMary P Borski, female    DOB: May 13, 1934, 81 y.o.   MRN: [id number]  Toe Pain  The incident occurred 2 days ago. The incident occurred at home. There was no injury mechanism. The pain is present in the left toes. The quality of the pain is described as aching. The pain is moderate. She reports no foreign bodies present. Nothing aggravates the symptoms. Treatments tried: tea tree oil. The treatment provided no relief.   Patient has numbness in her right arm that radiates down to her pinky finger. This has been present for several months now.  She denies weakness she just states tingling that intermittently occurs it goes down to her small finger occasional pain in the small finger  Review of Systems     Objective:   Physical Exam  She does have a inflamed second toe to the small toe some tenderness no abscess noted with it pulses are good in the feet Reflexes are good strength in the arms or visit     Assessment & Plan:  Patient with cellulitis same toe as a few months ago. I don't find any evidence of any type of underlying circulation issue. 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

## 2015-07-18 ENCOUNTER — Other Ambulatory Visit: Payer: Self-pay | Admitting: *Deleted

## 2015-07-18 ENCOUNTER — Telehealth: Payer: Self-pay | Admitting: *Deleted

## 2015-07-18 MED ORDER — DOXYCYCLINE HYCLATE 100 MG PO TABS: 100.0000 mg | ORAL_TABLET | Freq: Two times a day (BID) | ORAL | Status: DC

## 2015-07-18 NOTE — Telephone Encounter (Signed)
Pt seen 7/5 for toe pain. Pt states she took 14 days of augmentin. She started back on augmentin yesterday because toe is red and throbbing. Pt cannot come in today because she has to take husband to doctor. [facility].

## 2015-07-18 NOTE — Telephone Encounter (Signed)
Try doxycycline 100 mg 1 twice a day with food and a glass of water 10 day supply avoid excessive sun, if ongoing troubles let us know

## 2015-07-18 NOTE — Telephone Encounter (Signed)
lmrc

## 2015-07-18 NOTE — Telephone Encounter (Signed)
Discussed with pt. Med sent to pharm.  

## 2015-07-19 ENCOUNTER — Ambulatory Visit (INDEPENDENT_AMBULATORY_CARE_PROVIDER_SITE_OTHER): Payer: Medicare Other | Admitting: Family Medicine

## 2015-07-19 ENCOUNTER — Encounter: Payer: Self-pay | Admitting: Family Medicine

## 2015-07-19 VITALS — Temp 98.4°F | Ht 66.5 in | Wt 186.4 lb

## 2015-07-19 DIAGNOSIS — L0390 Cellulitis, unspecified: Secondary | ICD-10-CM

## 2015-07-19 DIAGNOSIS — L03031 Cellulitis of right toe: Secondary | ICD-10-CM | POA: Diagnosis not present

## 2015-07-19 MED ORDER — CEFTRIAXONE SODIUM 1 G IJ SOLR
500.0000 mg | Freq: Once | INTRAMUSCULAR | Status: AC
  Administered 2015-07-19: 500 mg via INTRAMUSCULAR

## 2015-07-19 NOTE — Progress Notes (Signed)
   Subjective:    Patient ID: Kristi Grant, female    DOB: 1934-12-22, 80 y.o.   MRN: [id number]  Toe Pain  The incident occurred 3 to 5 days ago. The incident occurred at home. There was no injury mechanism. The pain is present in the right toes. The quality of the pain is described as aching. The pain is moderate. The pain has been constant since onset. She reports no foreign bodies present. The symptoms are aggravated by movement. Treatments tried: doxycycline. The treatment provided mild relief.      Review of Systems No fever no vomiting relates moderate pain    Objective:   Physical Exam She has redness of the fourth toe swelling. No other follow-up findings. I don't feel it's an abscess I feel cellulitis       Assessment & Plan:  We will do x-ray to rule out osteomyelitis Rocephin 500 IM Continue current measures Doxycycline twice a day as directed  Patient may need referral to podiatry if ongoing troubles

## 2015-07-20 ENCOUNTER — Ambulatory Visit (HOSPITAL_COMMUNITY)
Admission: RE | Admit: 2015-07-20 | Discharge: 2015-07-20 | Disposition: A | Discharge status: Home / Self Care | Payer: Medicare Other | Source: Ambulatory Visit | Attending: Family Medicine | Admitting: Family Medicine

## 2015-07-20 DIAGNOSIS — L03115 Cellulitis of right lower limb: Secondary | ICD-10-CM | POA: Insufficient documentation

## 2015-07-25 ENCOUNTER — Encounter: Payer: Self-pay | Admitting: Family Medicine

## 2015-07-25 ENCOUNTER — Ambulatory Visit (INDEPENDENT_AMBULATORY_CARE_PROVIDER_SITE_OTHER): Payer: Medicare Other | Admitting: Family Medicine

## 2015-07-25 VITALS — BP 122/74 | Ht 66.5 in | Wt 185.4 lb

## 2015-07-25 DIAGNOSIS — E785 Hyperlipidemia, unspecified: Secondary | ICD-10-CM

## 2015-07-25 DIAGNOSIS — D509 Iron deficiency anemia, unspecified: Secondary | ICD-10-CM

## 2015-07-25 DIAGNOSIS — M79674 Pain in right toe(s): Secondary | ICD-10-CM | POA: Diagnosis not present

## 2015-07-25 DIAGNOSIS — I10 Essential (primary) hypertension: Secondary | ICD-10-CM | POA: Diagnosis not present

## 2015-07-25 NOTE — Progress Notes (Signed)
   Subjective:    Patient ID: Kristi Grant, female    DOB: 03-Jan-1934, 81 y.o.   MRN: [id number]  Hypertension This is a chronic problem. The current episode started more than 1 year ago. Pertinent negatives include no chest pain. There are no compliance problems.    Patient would like to discuss infection to 4th left toe. Patient would also like to have her breast examined. Patient states no other concerns this visit. Cataract left side  patient states she's taken her heart medicine on a regular basis denies any blood pressure issues  She also taking her cholesterol medicine regular issue with no problems.   she denies any breast masses. Hearing check with Dr Suszanne Conners, audiology  she states her toe infection is doing better but she wonders if she may be having gout   Review of Systems  Constitutional: Negative for activity change, appetite change and fatigue.  HENT: Negative for congestion.   Respiratory: Negative for cough.   Cardiovascular: Negative for chest pain.  Gastrointestinal: Negative for abdominal pain.  Endocrine: Negative for polydipsia and polyphagia.  Neurological: Negative for weakness.  Psychiatric/Behavioral: Negative for confusion.       Objective:   Physical Exam  Constitutional: She appears well-nourished. No distress.  Cardiovascular: Normal rate, regular rhythm and normal heart sounds.   No murmur heard. Pulmonary/Chest: Effort normal and breath sounds normal. No respiratory distress.  Musculoskeletal: She exhibits no edema.  Lymphadenopathy:    She has no cervical adenopathy.  Neurological: She is alert. She exhibits normal muscle tone.  Psychiatric: Her behavior is normal.  Vitals reviewed.  Breast exam nl no masses       Assessment & Plan:  1. Essential hypertension, benign  blood pressure control good continue current measures check metabolic 7 - Basic metabolic panel  2. Hyperlipemia  hyperlipidemia overall doing well continue current  measures check lab work - Lipid panel - Hepatic function panel  3. Pain of toe of right foot  reoccurring toe pain check uric acid level I believe that this is more likely cellulitis await the lab results - Uric acid  4. Iron deficiency anemia  patient with a history of iron deficient anemia recheck lab work. Patient had hysterectomy should no longer be having issues with this. - CBC with Differential/Platelet

## 2015-07-28 LAB — BASIC METABOLIC PANEL
BUN / CREAT RATIO: 24 (ref 11–26)
BUN: 21 mg/dL (ref 8–27)
CO2: 25 mmol/L (ref 18–29)
Calcium: 9.7 mg/dL (ref 8.7–10.3)
Chloride: 102 mmol/L (ref 97–108)
Creatinine, Ser: 0.86 mg/dL (ref 0.57–1.00)
GFR calc Af Amer: 75 mL/min/{1.73_m2} (ref >59)
GFR calc non Af Amer: 65 mL/min/{1.73_m2} (ref >59)
GLUCOSE: 88 mg/dL (ref 65–99)
POTASSIUM: 4.2 mmol/L (ref 3.5–5.2)
Sodium: 141 mmol/L (ref 134–144)

## 2015-07-28 LAB — HEPATIC FUNCTION PANEL
ALK PHOS: 63 IU/L (ref 39–117)
ALT: 16 IU/L (ref 0–32)
AST: 25 IU/L (ref 0–40)
Albumin: 4.3 g/dL (ref 3.5–4.8)
BILIRUBIN, DIRECT: 0.14 mg/dL (ref 0.00–0.40)
Bilirubin Total: 0.5 mg/dL (ref 0.0–1.2)
TOTAL PROTEIN: 6.9 g/dL (ref 6.0–8.5)

## 2015-07-28 LAB — CBC WITH DIFFERENTIAL/PLATELET
BASOS ABS: 0 10*3/uL (ref 0.0–0.2)
BASOS: 0 %
EOS (ABSOLUTE): 0.2 10*3/uL (ref 0.0–0.4)
Eos: 4 %
HEMATOCRIT: 37 % (ref 34.0–46.6)
Hemoglobin: 12.5 g/dL (ref 11.1–15.9)
IMMATURE GRANULOCYTES: 0 %
Immature Grans (Abs): 0 10*3/uL (ref 0.0–0.1)
LYMPHS: 59 %
Lymphocytes Absolute: 2.7 10*3/uL (ref 0.7–3.1)
MCH: 31 pg (ref 26.6–33.0)
MCHC: 33.8 g/dL (ref 31.5–35.7)
MCV: 92 fL (ref 79–97)
MONOS ABS: 0.3 10*3/uL (ref 0.1–0.9)
Monocytes: 6 %
Neutrophils Absolute: 1.5 10*3/uL (ref 1.4–7.0)
Neutrophils: 31 %
Platelets: 300 10*3/uL (ref 150–379)
RBC: 4.03 x10E6/uL (ref 3.77–5.28)
RDW: 14.6 % (ref 12.3–15.4)
WBC: 4.6 10*3/uL (ref 3.4–10.8)

## 2015-07-28 LAB — LIPID PANEL
Chol/HDL Ratio: 4.2 ratio units (ref 0.0–4.4)
Cholesterol, Total: 201 mg/dL — ABNORMAL HIGH (ref 100–199)
HDL: 48 mg/dL (ref >39)
LDL Calculated: 132 mg/dL — ABNORMAL HIGH (ref 0–99)
Triglycerides: 104 mg/dL (ref 0–149)
VLDL Cholesterol Cal: 21 mg/dL (ref 5–40)

## 2015-07-28 LAB — URIC ACID: Uric Acid: 7.7 mg/dL — ABNORMAL HIGH (ref 2.5–7.1)

## 2015-08-02 ENCOUNTER — Encounter: Payer: Self-pay | Admitting: Family Medicine

## 2015-08-02 ENCOUNTER — Other Ambulatory Visit: Payer: Self-pay | Admitting: *Deleted

## 2015-08-02 MED ORDER — ALLOPURINOL 100 MG PO TABS: 100.0000 mg | ORAL_TABLET | Freq: Every day | ORAL | Status: DC

## 2015-08-02 MED ORDER — PRAVASTATIN SODIUM 40 MG PO TABS: 40.0000 mg | ORAL_TABLET | Freq: Every day | ORAL | Status: DC

## 2015-08-18 ENCOUNTER — Ambulatory Visit (INDEPENDENT_AMBULATORY_CARE_PROVIDER_SITE_OTHER): Payer: Medicare Other | Admitting: Otolaryngology

## 2015-08-18 DIAGNOSIS — H903 Sensorineural hearing loss, bilateral: Secondary | ICD-10-CM

## 2015-09-05 ENCOUNTER — Other Ambulatory Visit: Payer: Self-pay | Admitting: Family Medicine

## 2015-09-23 ENCOUNTER — Other Ambulatory Visit: Payer: Self-pay | Admitting: *Deleted

## 2015-09-23 MED ORDER — PRAVASTATIN SODIUM 40 MG PO TABS: 40.0000 mg | ORAL_TABLET | Freq: Every day | ORAL | Status: DC

## 2015-10-06 ENCOUNTER — Telehealth: Payer: Self-pay | Admitting: Family Medicine

## 2015-10-06 DIAGNOSIS — E785 Hyperlipidemia, unspecified: Secondary | ICD-10-CM

## 2015-10-06 DIAGNOSIS — M109 Gout, unspecified: Secondary | ICD-10-CM

## 2015-10-06 DIAGNOSIS — Z79899 Other long term (current) drug therapy: Secondary | ICD-10-CM

## 2015-10-06 NOTE — Telephone Encounter (Signed)
Orders ready. Pt notified.  

## 2015-10-06 NOTE — Telephone Encounter (Signed)
Lipid, liver, uric acid level

## 2015-10-06 NOTE — Telephone Encounter (Signed)
Patient wants to know if she needs to have blood work done before her appointment on Monday 10/10/2015?

## 2015-10-08 LAB — LIPID PANEL
CHOL/HDL RATIO: 3.9 ratio (ref 0.0–4.4)
Cholesterol, Total: 178 mg/dL (ref 100–199)
HDL: 46 mg/dL (ref >39)
LDL Calculated: 113 mg/dL — ABNORMAL HIGH (ref 0–99)
Triglycerides: 95 mg/dL (ref 0–149)
VLDL CHOLESTEROL CAL: 19 mg/dL (ref 5–40)

## 2015-10-08 LAB — HEPATIC FUNCTION PANEL
ALBUMIN: 4.3 g/dL (ref 3.5–4.8)
ALT: 22 IU/L (ref 0–32)
AST: 31 IU/L (ref 0–40)
Alkaline Phosphatase: 62 IU/L (ref 39–117)
BILIRUBIN TOTAL: 0.5 mg/dL (ref 0.0–1.2)
Bilirubin, Direct: 0.13 mg/dL (ref 0.00–0.40)
Total Protein: 6.8 g/dL (ref 6.0–8.5)

## 2015-10-08 LAB — URIC ACID: Uric Acid: 6.2 mg/dL (ref 2.5–7.1)

## 2015-10-10 ENCOUNTER — Encounter: Payer: Self-pay | Admitting: Family Medicine

## 2015-10-10 ENCOUNTER — Ambulatory Visit (INDEPENDENT_AMBULATORY_CARE_PROVIDER_SITE_OTHER): Payer: Medicare Other | Admitting: Family Medicine

## 2015-10-10 VITALS — BP 114/64 | Ht 66.5 in | Wt 186.4 lb

## 2015-10-10 DIAGNOSIS — I10 Essential (primary) hypertension: Secondary | ICD-10-CM

## 2015-10-10 DIAGNOSIS — E785 Hyperlipidemia, unspecified: Secondary | ICD-10-CM

## 2015-10-10 DIAGNOSIS — M109 Gout, unspecified: Secondary | ICD-10-CM

## 2015-10-10 DIAGNOSIS — M79674 Pain in right toe(s): Secondary | ICD-10-CM | POA: Diagnosis not present

## 2015-10-10 MED ORDER — PRAVASTATIN SODIUM 20 MG PO TABS: 20.0000 mg | ORAL_TABLET | Freq: Every day | ORAL | Status: DC

## 2015-10-10 NOTE — Progress Notes (Signed)
   Subjective:    Patient ID: Kristi Grant, female    DOB: 08-Feb-1934, 81 y.o.   MRN: [id number]  Hypertension This is a chronic problem. The current episode started more than 1 year ago. The problem has been gradually improving since onset. Pertinent negatives include no chest pain. There are no associated agents to hypertension. There are no known risk factors for coronary artery disease. Treatments tried: triamterene-hctz. The current treatment provides moderate improvement. There are no compliance problems.    Patient states that she wants to discuss possible sleep apnea.she states her husband tells her that she stops breathing intermittently she wonders if this is a issue she would like to have further look that she denies sleepiness during the day does not fall asleep driving.   Also follow up on right foot/toe pain. This been doing much better no pain still has some redness to it. Been treated for gout. Hyperlipidemia recent lab work was completed she once the results she does state 40 mg 3 times a week and tolerates it.  Review of Systems  Constitutional: Negative for activity change, appetite change and fatigue.  HENT: Negative for congestion.   Respiratory: Negative for cough.   Cardiovascular: Negative for chest pain.  Gastrointestinal: Negative for abdominal pain.  Endocrine: Negative for polydipsia and polyphagia.  Musculoskeletal: Negative for arthralgias.  Neurological: Negative for weakness.  Psychiatric/Behavioral: Negative for confusion.       Objective:   Physical Exam  Constitutional: She appears well-nourished. No distress.  Cardiovascular: Normal rate, regular rhythm and normal heart sounds.   No murmur heard. Pulmonary/Chest: Effort normal and breath sounds normal. No respiratory distress.  Musculoskeletal: She exhibits no edema.  Lymphadenopathy:    She has no cervical adenopathy.  Neurological: She is alert. She exhibits normal muscle tone.  Psychiatric:  Her behavior is normal.  Vitals reviewed.         Assessment & Plan:  Gout-her uric acid level looks good. She is tolerating medicines well. She should continue this. Watch diet closely Hyperlipidemia recent lab work shows LDL slightly above current level she is doing 40 mg 3 times a week I recommended 20 mg daily. Recheck lipid liver and 6 months time Blood pressure good control currently continue current measures no changes necessary Pain in the toe while believe it was due to gout this is stable currently no need for further intervention. Possible sleep apnea-I gave her a [place] sleep questionnaire to fill in bring back in addition to that it does not sound bad enough to want testing unless questionnaire shows necessary to do so 25 minutes was spent with the patient. Greater than half the time was spent in discussion and answering questions and counseling regarding the issues that the patient came in for today.

## 2015-11-23 ENCOUNTER — Encounter: Payer: Self-pay | Admitting: Family Medicine

## 2015-12-30 ENCOUNTER — Encounter: Payer: Self-pay | Admitting: Nurse Practitioner

## 2015-12-30 ENCOUNTER — Ambulatory Visit (INDEPENDENT_AMBULATORY_CARE_PROVIDER_SITE_OTHER): Payer: Medicare Other | Admitting: Nurse Practitioner

## 2015-12-30 VITALS — BP 120/82 | Temp 98.7°F | Ht 66.5 in | Wt 189.0 lb

## 2015-12-30 DIAGNOSIS — R300 Dysuria: Secondary | ICD-10-CM | POA: Diagnosis not present

## 2015-12-30 LAB — POCT URINALYSIS DIPSTICK
PH UA: 6
SPEC GRAV UA: 1.015

## 2015-12-30 LAB — POCT UA - MICROSCOPIC ONLY
BACTERIA, U MICROSCOPIC: NEGATIVE
RBC, urine, microscopic: NEGATIVE
WBC, Ur, HPF, POC: NEGATIVE

## 2015-12-30 MED ORDER — CIPROFLOXACIN HCL 500 MG PO TABS: 500.0000 mg | ORAL_TABLET | Freq: Two times a day (BID) | ORAL | Status: DC

## 2015-12-31 ENCOUNTER — Encounter: Payer: Self-pay | Admitting: Nurse Practitioner

## 2015-12-31 NOTE — Progress Notes (Signed)
Subjective:  Presents for c/o urinary symptoms that began yesterday. Urgency. Dysuria. No fever. No recent UTI. No back pain. No vaginal discharge. No V/D. Taking fluids well.   Objective:   BP 120/82 mmHg  Temp(Src) 98.7 F (37.1 C) (Oral)  Ht 5' 6.5" (1.689 m)  Wt 189 lb (85.73 kg)  BMI 30.05 kg/m2 NAD. Alert, oriented. Lungs clear. No CVA tenderness. Abdomen soft, non distended with mild suprapubic area tenderness. Results for orders placed or performed in visit on 12/30/15  POCT UA - Microscopic Only  Result Value Ref Range   WBC, Ur, HPF, POC neg    RBC, urine, microscopic neg    Bacteria, U Microscopic neg    Mucus, UA     Epithelial cells, urine per micros     Crystals, Ur, HPF, POC     Casts, Ur, LPF, POC     Yeast, UA    POCT urinalysis dipstick  Result Value Ref Range   Color, UA     Clarity, UA     Glucose, UA     Bilirubin, UA     Ketones, UA     Spec Grav, UA 1.015    Blood, UA     pH, UA 6.0    Protein, UA     Urobilinogen, UA     Nitrite, UA     Leukocytes, UA  Negative     Assessment: Dysuria - Plan: POCT UA - Microscopic Only, POCT urinalysis dipstick, Urine culture  Plan:  Meds ordered this encounter  Medications  . ciprofloxacin (CIPRO) 500 MG tablet    Sig: Take 1 tablet (500 mg total) by mouth 2 (two) times daily.    Dispense:  14 tablet    Refill:  0    Order Specific Question:  Supervising Provider    Answer:  Merlyn AlbertLUKING, WILLIAM S [[id number]]   AZO as directed x 48 hours then DC. Warning signs reviewed. Call back in 72 hours if no improvement, sooner if worse.

## 2016-01-01 LAB — URINE CULTURE: ORGANISM ID, BACTERIA: NO GROWTH

## 2016-01-19 ENCOUNTER — Encounter: Payer: Medicare Other | Admitting: Family Medicine

## 2016-01-24 ENCOUNTER — Encounter: Payer: Self-pay | Admitting: Family Medicine

## 2016-01-24 ENCOUNTER — Ambulatory Visit (INDEPENDENT_AMBULATORY_CARE_PROVIDER_SITE_OTHER): Payer: Medicare Other | Admitting: Family Medicine

## 2016-01-24 VITALS — BP 134/86 | Ht 67.0 in | Wt 189.0 lb

## 2016-01-24 DIAGNOSIS — M109 Gout, unspecified: Secondary | ICD-10-CM | POA: Diagnosis not present

## 2016-01-24 DIAGNOSIS — Z79899 Other long term (current) drug therapy: Secondary | ICD-10-CM

## 2016-01-24 DIAGNOSIS — I10 Essential (primary) hypertension: Secondary | ICD-10-CM | POA: Diagnosis not present

## 2016-01-24 DIAGNOSIS — Z0000 Encounter for general adult medical examination without abnormal findings: Secondary | ICD-10-CM | POA: Diagnosis not present

## 2016-01-24 DIAGNOSIS — Z780 Asymptomatic menopausal state: Secondary | ICD-10-CM

## 2016-01-24 DIAGNOSIS — E785 Hyperlipidemia, unspecified: Secondary | ICD-10-CM

## 2016-01-24 DIAGNOSIS — M8580 Other specified disorders of bone density and structure, unspecified site: Secondary | ICD-10-CM

## 2016-01-24 MED ORDER — TRIAMTERENE-HCTZ 37.5-25 MG PO TABS: ORAL_TABLET | ORAL | Status: DC

## 2016-01-24 MED ORDER — PRAVASTATIN SODIUM 20 MG PO TABS: 20.0000 mg | ORAL_TABLET | Freq: Every day | ORAL | Status: DC

## 2016-01-24 MED ORDER — METOPROLOL TARTRATE 100 MG PO TABS: 100.0000 mg | ORAL_TABLET | Freq: Every day | ORAL | Status: DC

## 2016-01-24 MED ORDER — ALLOPURINOL 100 MG PO TABS: 100.0000 mg | ORAL_TABLET | Freq: Every day | ORAL | Status: DC

## 2016-01-24 NOTE — Progress Notes (Signed)
   Subjective:    Patient ID: Kristi Grant, female    DOB: [birth year], 82 y.o.   MRN: [id number]  HPI AWV- Annual Wellness Visit  The patient was seen for their annual wellness visit. The patient's past medical history, surgical history, and family history were reviewed. Pertinent vaccines were reviewed ( tetanus, pneumonia, shingles, flu) The patient's medication list was reviewed and updated.  The height and weight were entered. The patient's current BMI is: 29.60   Cognitive screening was completed. Outcome of Mini - Cog: pass  Falls within the past 6 months: none  Current tobacco usage: none (All patients who use tobacco were given written and verbal information on quitting)  Recent listing of emergency department/hospitalizations over the past year were reviewed.  current specialist the patient sees on a regular basis: cataract surgery sept 2016 and hearing aids oct 2016   Medicare annual wellness visit patient questionnaire was reviewed.  A written screening schedule for the patient for the next 5-10 years was given. Appropriate discussion of followup regarding next visit was discussed.  Right hip pain off and on for a couple of months. Using ice.      Review of Systems  Constitutional: Negative for activity change, appetite change and fatigue.  HENT: Negative for congestion, ear discharge and rhinorrhea.   Eyes: Negative for discharge.  Respiratory: Negative for cough, chest tightness and wheezing.   Cardiovascular: Negative for chest pain.  Gastrointestinal: Negative for vomiting and abdominal pain.  Genitourinary: Negative for frequency and difficulty urinating.  Musculoskeletal: Negative for neck pain.  Allergic/Immunologic: Negative for environmental allergies and food allergies.  Neurological: Negative for weakness and headaches.  Psychiatric/Behavioral: Negative for behavioral problems and agitation.       Objective:   Physical Exam  Constitutional: She  is oriented to person, place, and time. She appears well-developed and well-nourished.  HENT:  Head: Normocephalic.  Right Ear: External ear normal.  Left Ear: External ear normal.  Eyes: Pupils are equal, round, and reactive to light.  Neck: Normal range of motion. No thyromegaly present.  Cardiovascular: Normal rate, regular rhythm, normal heart sounds and intact distal pulses.   No murmur heard. Pulmonary/Chest: Effort normal and breath sounds normal. No respiratory distress. She has no wheezes.  Abdominal: Soft. Bowel sounds are normal. She exhibits no distension and no mass. There is no tenderness.  Musculoskeletal: Normal range of motion. She exhibits no edema or tenderness.  Lymphadenopathy:    She has no cervical adenopathy.  Neurological: She is alert and oriented to person, place, and time. She exhibits normal muscle tone.  Skin: Skin is warm and dry.  Psychiatric: She has a normal mood and affect. Her behavior is normal.    We spent approximate 15 minutes in addition to his her physical talking about healthy diet regular physical activity the importance of keeping gout cholesterol blood pressure under good control and refilling medications.      Assessment & Plan:  Wellness-safety dietary all discussed. Patient overall doing well. Cognitively doing well. Up-to-date on mammogram as well as colonoscopy. In addition to this patient had some additional issues  Blood pressure under good control check lab work later this spring Hyperlipidemia watch diet closely exercise Gout under good control with medication refills given recheck uric acid level in 6 weeks' time Bone density recommended.

## 2016-01-25 IMAGING — DX DG FOOT COMPLETE 3+V*R*
3 series · 3 of 3 positions shown · non-contrast
Comparison: None.

CLINICAL DATA: Cellulitis

EXAM:
RIGHT FOOT COMPLETE - 3+ VIEW

[foot ap]
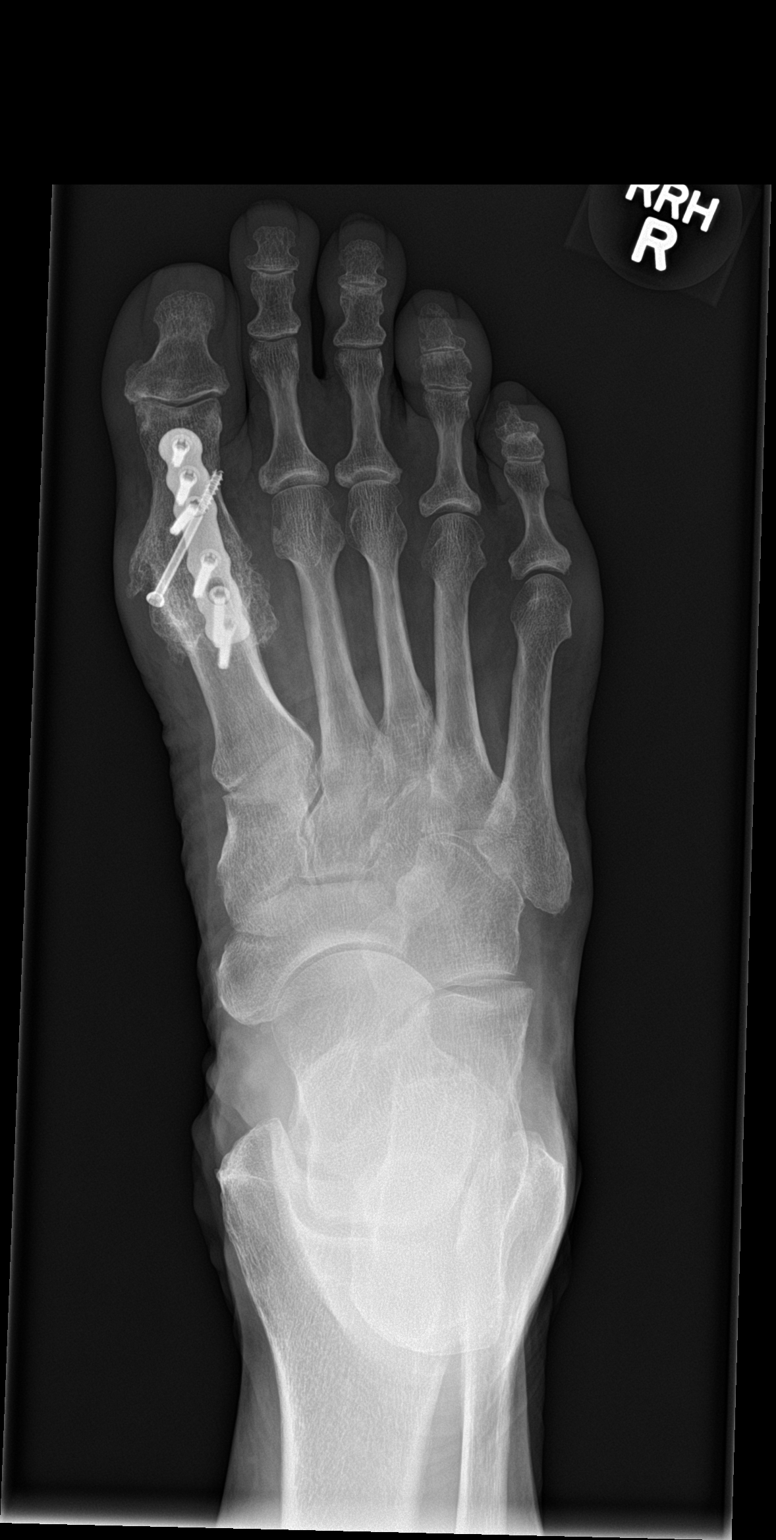

[foot obl]
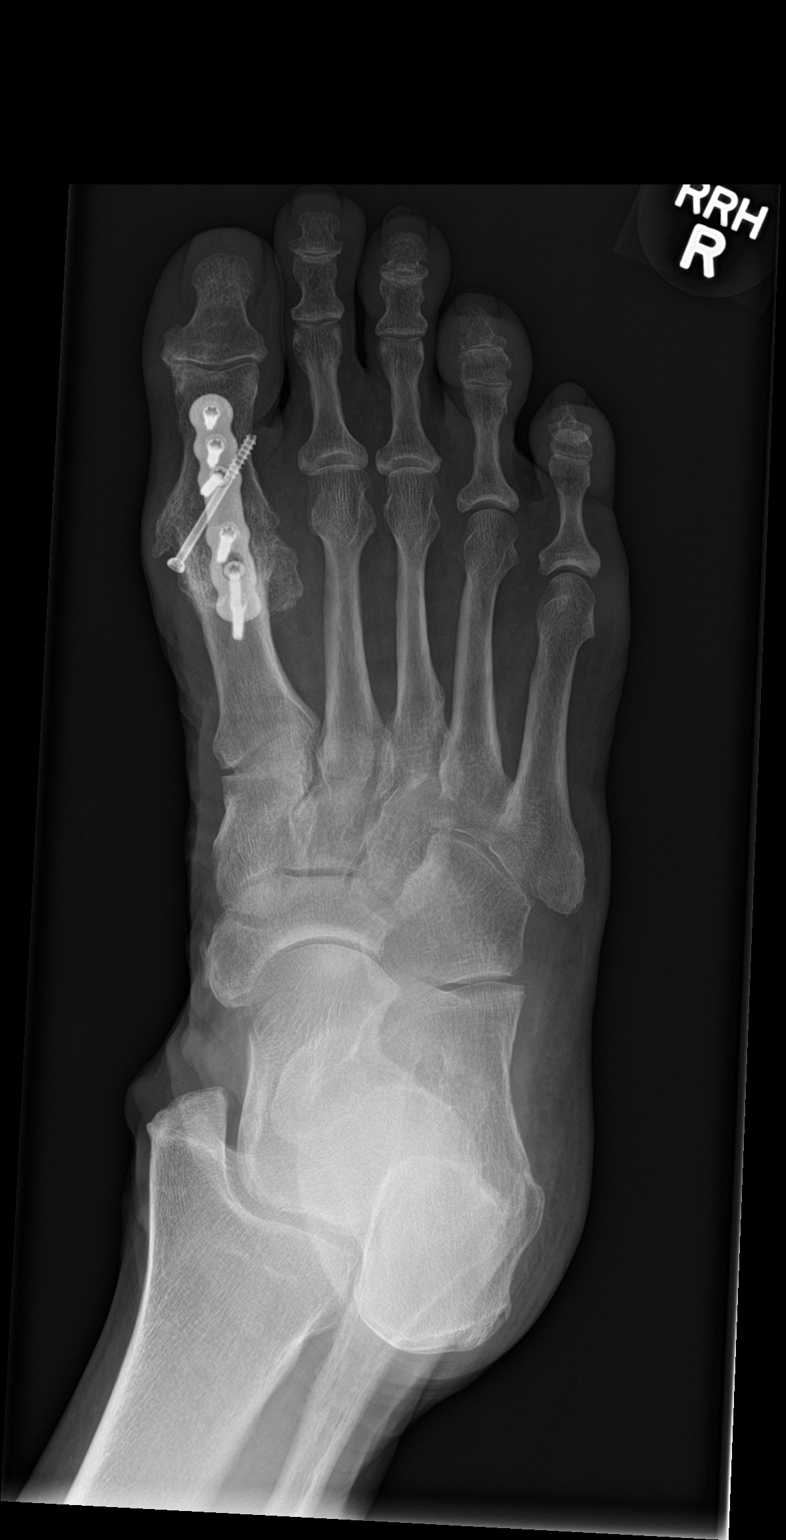

[foot lat]
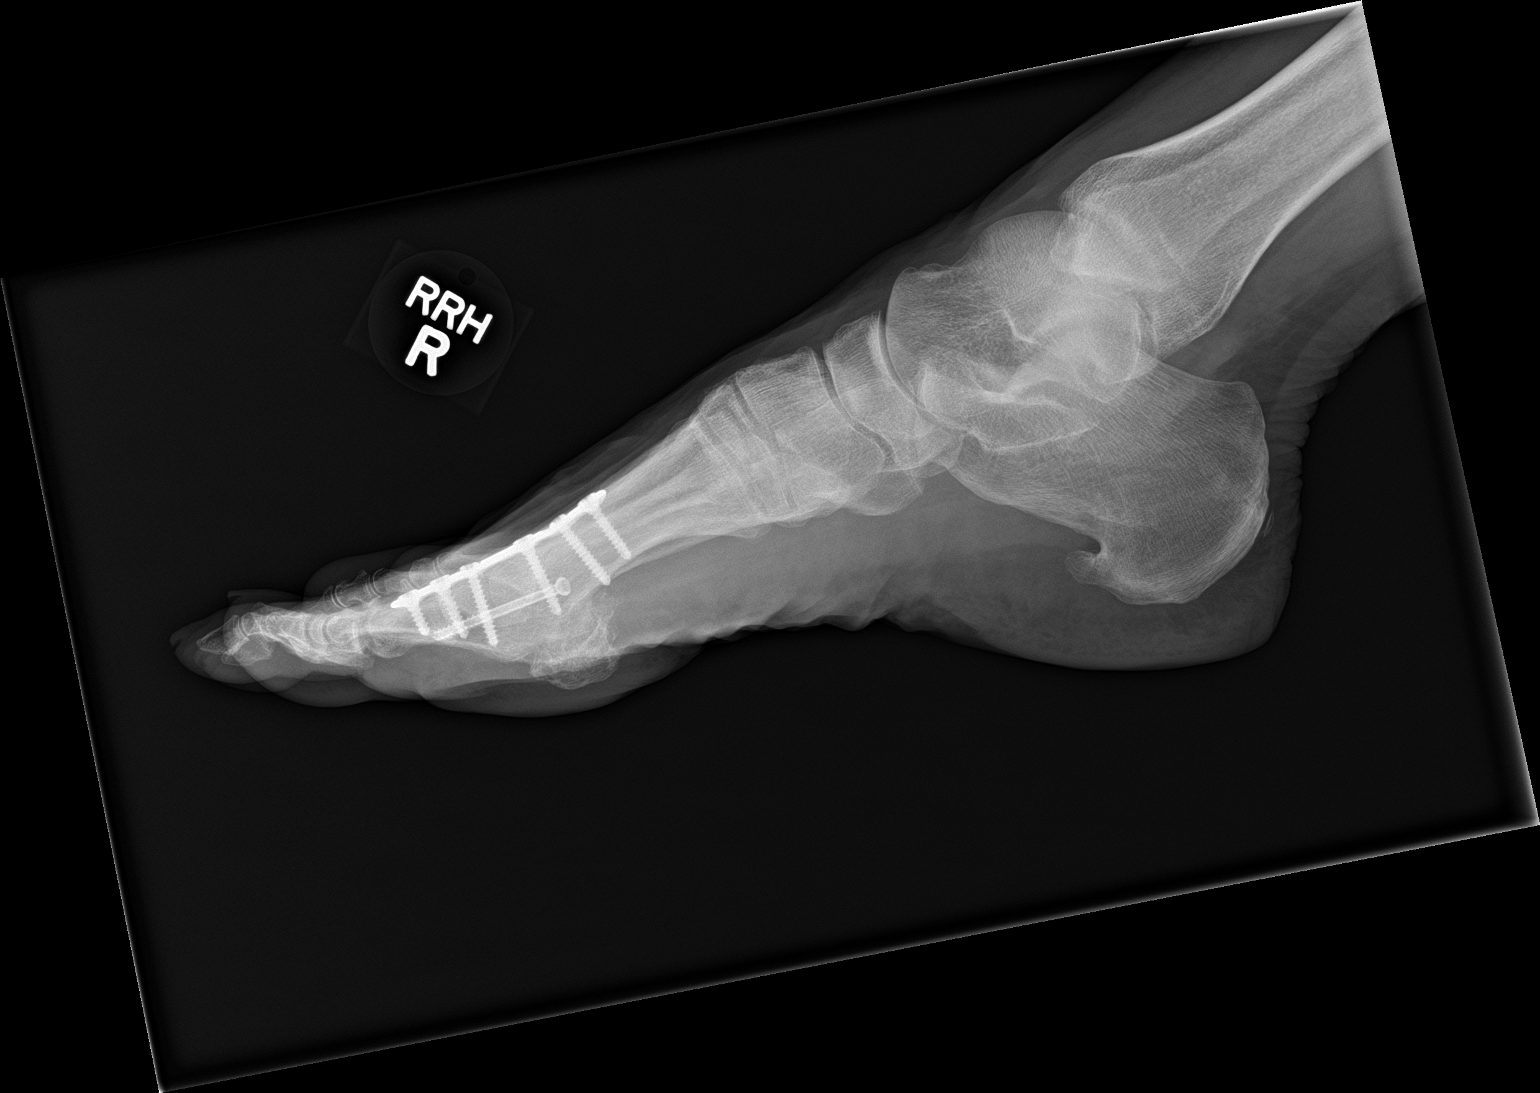

[3 of 3 positions shown; findings below may reference images not displayed]

FINDINGS: Plate and screws transfix the first metatarsophalangeal joint. There
is no breakage or loosening of the hardware. The the first
metatarsophalangeal joint appears fused with bony bridging. There is
spurring at the inferior calcaneus. Mild degenerative change
involving the IP joint of the great toe. No fracture or dislocation.
There is no evidence of bony destruction. Specifically, there is
node lytic lesion within the phalanges of the fourth toe.
IMPRESSION: No acute bony pathology.

## 2016-01-30 ENCOUNTER — Ambulatory Visit (HOSPITAL_COMMUNITY)
Admission: RE | Admit: 2016-01-30 | Discharge: 2016-01-30 | Disposition: A | Discharge status: Home / Self Care | Payer: Medicare Other | Source: Ambulatory Visit | Attending: Family Medicine | Admitting: Family Medicine

## 2016-01-30 DIAGNOSIS — M8580 Other specified disorders of bone density and structure, unspecified site: Secondary | ICD-10-CM | POA: Diagnosis not present

## 2016-01-30 DIAGNOSIS — Z780 Asymptomatic menopausal state: Secondary | ICD-10-CM | POA: Insufficient documentation

## 2016-05-04 LAB — BASIC METABOLIC PANEL
BUN / CREAT RATIO: 25 (ref 12–28)
BUN: 24 mg/dL (ref 8–27)
CALCIUM: 10 mg/dL (ref 8.7–10.3)
CO2: 25 mmol/L (ref 18–29)
CREATININE: 0.96 mg/dL (ref 0.57–1.00)
Chloride: 100 mmol/L (ref 96–106)
GFR calc Af Amer: 65 mL/min/{1.73_m2} (ref >59)
GFR calc non Af Amer: 56 mL/min/{1.73_m2} — ABNORMAL LOW (ref >59)
GLUCOSE: 93 mg/dL (ref 65–99)
Potassium: 4.4 mmol/L (ref 3.5–5.2)
Sodium: 143 mmol/L (ref 134–144)

## 2016-05-04 LAB — LIPID PANEL
CHOLESTEROL TOTAL: 199 mg/dL (ref 100–199)
Chol/HDL Ratio: 3.9 ratio units (ref 0.0–4.4)
HDL: 51 mg/dL (ref >39)
LDL CALC: 128 mg/dL — AB (ref 0–99)
Triglycerides: 98 mg/dL (ref 0–149)
VLDL Cholesterol Cal: 20 mg/dL (ref 5–40)

## 2016-05-04 LAB — HEPATIC FUNCTION PANEL
ALT: 16 IU/L (ref 0–32)
AST: 26 IU/L (ref 0–40)
Albumin: 4.5 g/dL (ref 3.5–4.8)
Alkaline Phosphatase: 67 IU/L (ref 39–117)
BILIRUBIN TOTAL: 0.5 mg/dL (ref 0.0–1.2)
BILIRUBIN, DIRECT: 0.14 mg/dL (ref 0.00–0.40)
TOTAL PROTEIN: 7.1 g/dL (ref 6.0–8.5)

## 2016-05-04 LAB — URIC ACID: URIC ACID: 6.4 mg/dL (ref 2.5–7.1)

## 2016-05-07 ENCOUNTER — Other Ambulatory Visit: Payer: Self-pay | Admitting: *Deleted

## 2016-05-07 DIAGNOSIS — Z79899 Other long term (current) drug therapy: Secondary | ICD-10-CM

## 2016-05-07 DIAGNOSIS — E785 Hyperlipidemia, unspecified: Secondary | ICD-10-CM

## 2016-05-07 MED ORDER — PRAVASTATIN SODIUM 40 MG PO TABS: 40.0000 mg | ORAL_TABLET | Freq: Every day | ORAL | Status: DC

## 2016-08-16 ENCOUNTER — Other Ambulatory Visit: Payer: Self-pay | Admitting: Family Medicine

## 2016-09-25 ENCOUNTER — Telehealth: Payer: Self-pay | Admitting: Family Medicine

## 2016-09-25 NOTE — Telephone Encounter (Signed)
error 

## 2016-09-27 LAB — HEPATIC FUNCTION PANEL
ALBUMIN: 4.6 g/dL (ref 3.5–4.8)
ALT: 19 IU/L (ref 0–32)
AST: 26 IU/L (ref 0–40)
Alkaline Phosphatase: 60 IU/L (ref 39–117)
BILIRUBIN TOTAL: 0.5 mg/dL (ref 0.0–1.2)
Bilirubin, Direct: 0.1 mg/dL (ref 0.00–0.40)
TOTAL PROTEIN: 7.1 g/dL (ref 6.0–8.5)

## 2016-09-27 LAB — LIPID PANEL
CHOL/HDL RATIO: 4.1 ratio (ref 0.0–4.4)
CHOLESTEROL TOTAL: 199 mg/dL (ref 100–199)
HDL: 48 mg/dL (ref >39)
LDL CALC: 128 mg/dL — AB (ref 0–99)
Triglycerides: 117 mg/dL (ref 0–149)
VLDL Cholesterol Cal: 23 mg/dL (ref 5–40)

## 2016-10-02 ENCOUNTER — Ambulatory Visit (INDEPENDENT_AMBULATORY_CARE_PROVIDER_SITE_OTHER): Payer: Medicare Other | Admitting: Family Medicine

## 2016-10-02 ENCOUNTER — Encounter: Payer: Self-pay | Admitting: Family Medicine

## 2016-10-02 VITALS — BP 138/80 | Ht 67.0 in | Wt 191.0 lb

## 2016-10-02 DIAGNOSIS — M1000 Idiopathic gout, unspecified site: Secondary | ICD-10-CM | POA: Diagnosis not present

## 2016-10-02 DIAGNOSIS — Z1231 Encounter for screening mammogram for malignant neoplasm of breast: Secondary | ICD-10-CM | POA: Diagnosis not present

## 2016-10-02 DIAGNOSIS — I10 Essential (primary) hypertension: Secondary | ICD-10-CM

## 2016-10-02 DIAGNOSIS — E783 Hyperchylomicronemia: Secondary | ICD-10-CM

## 2016-10-02 DIAGNOSIS — M25512 Pain in left shoulder: Secondary | ICD-10-CM

## 2016-10-02 DIAGNOSIS — L989 Disorder of the skin and subcutaneous tissue, unspecified: Secondary | ICD-10-CM

## 2016-10-02 MED ORDER — TRIAMTERENE-HCTZ 37.5-25 MG PO TABS: ORAL_TABLET | ORAL | 1 refills | Status: DC

## 2016-10-02 MED ORDER — PRAVASTATIN SODIUM 20 MG PO TABS: 20.0000 mg | ORAL_TABLET | Freq: Every day | ORAL | 1 refills | Status: DC

## 2016-10-02 MED ORDER — ALLOPURINOL 100 MG PO TABS: 100.0000 mg | ORAL_TABLET | Freq: Every day | ORAL | 1 refills | Status: DC

## 2016-10-02 MED ORDER — METOPROLOL TARTRATE 100 MG PO TABS: 100.0000 mg | ORAL_TABLET | Freq: Every day | ORAL | 1 refills | Status: DC

## 2016-10-02 NOTE — Progress Notes (Signed)
   Subjective:    Patient ID: Kristi LopesMary P Grant, female    DOB: [birth year], 82 y.o.   MRN: [id number]  Hyperlipidemia  This is a chronic problem. The current episode started more than 1 year ago. Pertinent negatives include no chest pain. Treatments tried: pravastatin   pt decreased pravasatin on her own. She was prescribed 40mg  but was having muscle cramps so now she takes one half daily. Still has some cramps. Wants to discuss if she can take every other day.  Very nice patient who is going through a lot of stress her husband has cancer [facility]   Had laser surgery on eyes. One done in august and the other in September.   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 Dermatologist remove this now she has a spot on her right lower leg that she is concerned about becoming dark and hard. She also has significant varicose veins on her legs.  Already had flu vaccine at pharm. Added to epic.   Bumps on lower right leg. See discussion above.  Left shoulder pain. Started at least 2 months ago. Hurts when she reaches backwards and when she stretches her arm in the wrong way causes pain in the shoulder radiates down the arm denies any injury to it.  Needs 90 day refills on all meds to [facility].         Review of Systems  Constitutional: Negative for activity change, appetite change and fatigue.  HENT: Negative for congestion.   Respiratory: Negative for cough.   Cardiovascular: Negative for chest pain.  Gastrointestinal: Negative for abdominal pain.  Endocrine: Negative for polydipsia and polyphagia.  Neurological: Negative for weakness.  Psychiatric/Behavioral: Negative for confusion.       Objective:   Physical Exam  Constitutional: She appears well-nourished. No distress.  Cardiovascular: Normal rate, regular rhythm and normal heart sounds.   No murmur heard. Pulmonary/Chest: Effort normal and breath sounds normal. No respiratory distress.    Musculoskeletal: She exhibits no edema.  Lymphadenopathy:    She has no cervical adenopathy.  Neurological: She is alert. She exhibits normal muscle tone.  Psychiatric: Her behavior is normal.  Vitals reviewed.         Assessment & Plan:  1. Encounter for screening mammogram for breast cancer Female oh ordered - MM SCREENING BREAST TOMO BILATERAL  2. Essential hypertension, benign Blood pressure under decent control continue dietary measures and medications  3. Hyperchylomicronemia Hyperlipidemia continue medication watch diet closely previous labs reviewed, reduce the cholesterol medicine to 20 mg daily if she cannot tolerate this she will let us know  4. Idiopathic gout, unspecified chronicity, unspecified site No flareups recently continue allopurinol lab work in the spring  5. Acute pain of left shoulder Exercises shown if ongoing trouble referral to orthopedics  6. Skin lesion Patient states she will set herself up with dermatology  25 minutes spent with patient greater than half in discussion of multiple issues and answering questions

## 2016-10-19 ENCOUNTER — Ambulatory Visit (INDEPENDENT_AMBULATORY_CARE_PROVIDER_SITE_OTHER): Payer: Medicare Other | Admitting: Family Medicine

## 2016-10-19 ENCOUNTER — Encounter: Payer: Self-pay | Admitting: Family Medicine

## 2016-10-19 VITALS — BP 114/74 | Temp 98.4°F | Ht 67.0 in | Wt 187.5 lb

## 2016-10-19 DIAGNOSIS — R309 Painful micturition, unspecified: Secondary | ICD-10-CM | POA: Diagnosis not present

## 2016-10-19 DIAGNOSIS — N3000 Acute cystitis without hematuria: Secondary | ICD-10-CM

## 2016-10-19 MED ORDER — CIPROFLOXACIN HCL 500 MG PO TABS: 500.0000 mg | ORAL_TABLET | Freq: Two times a day (BID) | ORAL | 0 refills | Status: DC

## 2016-10-19 NOTE — Progress Notes (Signed)
   Subjective:    Patient ID: Kristi Grant, female    DOB: July 24, 1934, 82 y.o.   MRN: [id number]  Urinary Tract Infection   This is a new problem. The current episode started yesterday. Associated symptoms comments: Abdominal pain, Bladder pain, low back pain            . Treatments tried: Azo.   Patient with intermittent lower abdominal pain lower back pain denies high fever chills sweats relates some dysuria and urinary from C. Patient would like to discuss dermatology.  Dermatologist recommended seen a vascular doctor to have that area removed we will get the notes sent to us Review of Systems Denies high fever chills see above. No vomiting or diarrhea.    Objective:   Physical Exam Lungs clear hearts regular flank nontender abdomen soft lower abdominal tenderness urinalysis with some WBCs       Assessment & Plan:  Urine culture sent Antibiotic prescribed Follow-up if ongoing trouble.

## 2016-10-21 LAB — URINE CULTURE

## 2016-11-02 ENCOUNTER — Ambulatory Visit (HOSPITAL_COMMUNITY)
Admission: RE | Admit: 2016-11-02 | Discharge: 2016-11-02 | Disposition: A | Discharge status: Home / Self Care | Payer: Medicare Other | Source: Ambulatory Visit | Attending: Family Medicine | Admitting: Family Medicine

## 2016-11-02 DIAGNOSIS — Z1231 Encounter for screening mammogram for malignant neoplasm of breast: Secondary | ICD-10-CM | POA: Diagnosis present

## 2017-02-04 ENCOUNTER — Other Ambulatory Visit: Payer: Self-pay | Admitting: Family Medicine

## 2017-06-03 ENCOUNTER — Other Ambulatory Visit: Payer: Self-pay | Admitting: Family Medicine

## 2017-06-24 ENCOUNTER — Telehealth: Payer: Self-pay | Admitting: Family Medicine

## 2017-06-24 NOTE — Telephone Encounter (Signed)
Lipid, liver, metabolic 7, uric acid-HTN, hyperlipidemia, gout

## 2017-06-24 NOTE — Telephone Encounter (Signed)
Patient is requesting order for blood work to have done before appointment on 07/19/17 with Dr. Lorin PicketScott.

## 2017-06-24 NOTE — Telephone Encounter (Signed)
Last labs 09/26/16 lipid, liver, bmp, uric acid

## 2017-06-25 ENCOUNTER — Other Ambulatory Visit: Payer: Self-pay

## 2017-06-25 DIAGNOSIS — Z79899 Other long term (current) drug therapy: Secondary | ICD-10-CM

## 2017-06-25 DIAGNOSIS — M109 Gout, unspecified: Secondary | ICD-10-CM

## 2017-06-25 DIAGNOSIS — E785 Hyperlipidemia, unspecified: Secondary | ICD-10-CM

## 2017-06-25 DIAGNOSIS — I10 Essential (primary) hypertension: Secondary | ICD-10-CM

## 2017-06-25 NOTE — Telephone Encounter (Signed)
Pt is aware the lab orders have been sent to [facility] .

## 2017-06-25 NOTE — Telephone Encounter (Signed)
I called and left a vm.Asked that she r/c. 

## 2017-07-01 ENCOUNTER — Other Ambulatory Visit: Payer: Self-pay | Admitting: Family Medicine

## 2017-07-16 LAB — HEPATIC FUNCTION PANEL
ALT: 13 IU/L (ref 0–32)
AST: 25 IU/L (ref 0–40)
Albumin: 4.5 g/dL (ref 3.5–4.7)
Alkaline Phosphatase: 70 IU/L (ref 39–117)
BILIRUBIN TOTAL: 0.4 mg/dL (ref 0.0–1.2)
Bilirubin, Direct: 0.1 mg/dL (ref 0.00–0.40)
Total Protein: 7.2 g/dL (ref 6.0–8.5)

## 2017-07-16 LAB — BASIC METABOLIC PANEL
BUN / CREAT RATIO: 15 (ref 12–28)
BUN: 13 mg/dL (ref 8–27)
CO2: 27 mmol/L (ref 20–29)
CREATININE: 0.89 mg/dL (ref 0.57–1.00)
Calcium: 10 mg/dL (ref 8.7–10.3)
Chloride: 102 mmol/L (ref 96–106)
GFR, EST AFRICAN AMERICAN: 71 mL/min/{1.73_m2} (ref >59)
GFR, EST NON AFRICAN AMERICAN: 61 mL/min/{1.73_m2} (ref >59)
Glucose: 97 mg/dL (ref 65–99)
Potassium: 4.6 mmol/L (ref 3.5–5.2)
SODIUM: 144 mmol/L (ref 134–144)

## 2017-07-16 LAB — LIPID PANEL
CHOLESTEROL TOTAL: 185 mg/dL (ref 100–199)
Chol/HDL Ratio: 3.9 ratio (ref 0.0–4.4)
HDL: 48 mg/dL (ref >39)
LDL Calculated: 110 mg/dL — ABNORMAL HIGH (ref 0–99)
TRIGLYCERIDES: 137 mg/dL (ref 0–149)
VLDL CHOLESTEROL CAL: 27 mg/dL (ref 5–40)

## 2017-07-16 LAB — URIC ACID: Uric Acid: 5.3 mg/dL (ref 2.5–7.1)

## 2017-07-19 ENCOUNTER — Encounter: Payer: Self-pay | Admitting: Family Medicine

## 2017-07-19 ENCOUNTER — Other Ambulatory Visit: Payer: Self-pay | Admitting: Family Medicine

## 2017-07-19 ENCOUNTER — Ambulatory Visit (INDEPENDENT_AMBULATORY_CARE_PROVIDER_SITE_OTHER): Payer: Medicare Other | Admitting: Family Medicine

## 2017-07-19 VITALS — Ht 67.0 in | Wt 188.0 lb

## 2017-07-19 DIAGNOSIS — M1000 Idiopathic gout, unspecified site: Secondary | ICD-10-CM

## 2017-07-19 DIAGNOSIS — I10 Essential (primary) hypertension: Secondary | ICD-10-CM

## 2017-07-19 DIAGNOSIS — E7849 Other hyperlipidemia: Secondary | ICD-10-CM

## 2017-07-19 DIAGNOSIS — Z0000 Encounter for general adult medical examination without abnormal findings: Secondary | ICD-10-CM

## 2017-07-19 DIAGNOSIS — E784 Other hyperlipidemia: Secondary | ICD-10-CM

## 2017-07-19 MED ORDER — ZOSTER VAC RECOMB ADJUVANTED 50 MCG/0.5ML IM SUSR: 0.5000 mL | Freq: Once | INTRAMUSCULAR | 1 refills | Status: AC

## 2017-07-19 MED ORDER — OLOPATADINE HCL 0.1 % OP SOLN: 1.0000 [drp] | Freq: Two times a day (BID) | OPHTHALMIC | 12 refills | Status: DC

## 2017-07-19 MED ORDER — PRAVASTATIN SODIUM 20 MG PO TABS: 20.0000 mg | ORAL_TABLET | Freq: Every day | ORAL | 1 refills | Status: DC

## 2017-07-19 MED ORDER — METOPROLOL TARTRATE 25 MG PO TABS: 25.0000 mg | ORAL_TABLET | Freq: Two times a day (BID) | ORAL | 5 refills | Status: DC

## 2017-07-19 MED ORDER — AMLODIPINE BESYLATE 2.5 MG PO TABS: 2.5000 mg | ORAL_TABLET | Freq: Every day | ORAL | 3 refills | Status: DC

## 2017-07-19 MED ORDER — ALLOPURINOL 100 MG PO TABS: 100.0000 mg | ORAL_TABLET | Freq: Every day | ORAL | 1 refills | Status: DC

## 2017-07-19 NOTE — Progress Notes (Signed)
Subjective:    Patient ID: Kristi LopesMary P Grant, female    DOB: [birth year], 83 y.o.   MRN: [id number]  HPI AWV- Annual Wellness Visit  The patient was seen for their annual wellness visit. The patient's past medical history, surgical history, and family history were reviewed. Pertinent vaccines were reviewed ( tetanus, pneumonia, shingles, flu) The patient's medication list was reviewed and updated.  The height and weight were entered. The patient's current BMI is:29.5  Cognitive screening was completed. Outcome of Mini - Cog: Passed  Falls within the past 6 months:None  Current tobacco usage: None (All patients who use tobacco were given written and verbal information on quitting)  Recent listing of emergency department/hospitalizations over the past year were reviewed.  current specialist the patient sees on a regular basis: Has seen the eye dr,dentisit,dermatologist all preventative appointments.   Medicare annual wellness visit patient questionnaire was reviewed.  A written screening schedule for the patient for the next 5-10 years was given. Appropriate discussion of followup regarding next visit was discussed.    Wants to have a breast exam today.States she has a dry eye condition has red eyes and they hurt.  Has questions regarding Hctz and gout. Gets lightheaded when stoops to bend over. Patient having some lightheaded spells when she bends over and stands back up she also relates feeling somewhat fatigued and tired she also finds herself feeling woozy at times  She tries the healthy denies any flareups of gout  Denies any rectal bleeding hematuria is going to get her mammogram later this year  Review of Systems  Constitutional: Negative for activity change, appetite change and fatigue.  HENT: Negative for congestion, ear discharge and rhinorrhea.   Eyes: Negative for discharge.  Respiratory: Negative for cough, chest tightness and wheezing.   Cardiovascular: Negative  for chest pain.  Gastrointestinal: Negative for abdominal pain and vomiting.  Genitourinary: Negative for difficulty urinating and frequency.  Musculoskeletal: Negative for neck pain.  Allergic/Immunologic: Negative for environmental allergies and food allergies.  Neurological: Negative for weakness and headaches.  Psychiatric/Behavioral: Negative for agitation and behavioral problems.       Objective:   Physical Exam  Constitutional: She is oriented to person, place, and time. She appears well-developed and well-nourished.  HENT:  Head: Normocephalic.  Right Ear: External ear normal.  Left Ear: External ear normal.  Eyes: Pupils are equal, round, and reactive to light.  Neck: Normal range of motion. No thyromegaly present.  Cardiovascular: Normal rate, regular rhythm, normal heart sounds and intact distal pulses.   No murmur heard. Pulmonary/Chest: Effort normal and breath sounds normal. No respiratory distress. She has no wheezes.  Abdominal: Soft. Bowel sounds are normal. She exhibits no distension and no mass. There is no tenderness.  Musculoskeletal: Normal range of motion. She exhibits no edema or tenderness.  Lymphadenopathy:    She has no cervical adenopathy.  Neurological: She is alert and oriented to person, place, and time. She exhibits normal muscle tone.  Skin: Skin is warm and dry.  Psychiatric: She has a normal mood and affect. Her behavior is normal.          Assessment & Plan:  Patient does not need colonoscopy Adult wellness-complete.wellness physical was conducted today. Importance of diet and exercise were discussed in detail. In addition to this a discussion regarding safety was also covered. We also reviewed over immunizations and gave recommendations regarding current immunization needed for age. In addition to this additional areas were also touched on  including: Preventative health exams needed: Colonoscopy 2016  Patient was advised yearly wellness  exam  Orthostatic hypotension stop current medication reduce metoprolol to 25 mg twice a day stop diuretic start amlodipine 2.5 mg daily recheck patient in 3-4 weeks  Gout stable continue current medication kidney functions look good  Hyperlipidemia previous labs reviewed Lab reviewed continue medication

## 2017-08-19 ENCOUNTER — Ambulatory Visit: Payer: Medicare Other | Admitting: Family Medicine

## 2017-08-21 ENCOUNTER — Ambulatory Visit (INDEPENDENT_AMBULATORY_CARE_PROVIDER_SITE_OTHER): Payer: Medicare Other | Admitting: Family Medicine

## 2017-08-21 ENCOUNTER — Encounter: Payer: Self-pay | Admitting: Family Medicine

## 2017-08-21 VITALS — BP 128/66 | Ht 67.0 in | Wt 186.4 lb

## 2017-08-21 DIAGNOSIS — I10 Essential (primary) hypertension: Secondary | ICD-10-CM | POA: Diagnosis not present

## 2017-08-21 MED ORDER — METOPROLOL TARTRATE 25 MG PO TABS: 25.0000 mg | ORAL_TABLET | Freq: Two times a day (BID) | ORAL | 5 refills | Status: DC

## 2017-08-21 MED ORDER — HYDROCORTISONE 2.5 % EX CREA: TOPICAL_CREAM | CUTANEOUS | 0 refills | Status: DC

## 2017-08-21 MED ORDER — MUPIROCIN 2 % EX OINT: TOPICAL_OINTMENT | CUTANEOUS | 0 refills | Status: DC

## 2017-08-21 MED ORDER — ALLOPURINOL 100 MG PO TABS: 100.0000 mg | ORAL_TABLET | Freq: Every day | ORAL | 1 refills | Status: DC

## 2017-08-21 MED ORDER — AMLODIPINE BESYLATE 2.5 MG PO TABS: 2.5000 mg | ORAL_TABLET | Freq: Every day | ORAL | 3 refills | Status: DC

## 2017-08-21 MED ORDER — PRAVASTATIN SODIUM 20 MG PO TABS: 20.0000 mg | ORAL_TABLET | Freq: Every day | ORAL | 1 refills | Status: DC

## 2017-08-21 NOTE — Progress Notes (Signed)
   Subjective:    Patient ID: Kristi LopesMary P Boeke, female    DOB: November 20, 1934, 83 y.o.   MRN: [id number]  Hypertension  This is a chronic problem. The current episode started more than 1 year ago. Pertinent negatives include no chest pain, headaches or shortness of breath. There are no compliance problems.   She states she is taken the new regimen on blood pressure medicine seems to be tolerating it okay Patient has c/o bruise to lower right leg. Patient would also like to discuss if she should start a regimen of baby aspirin daily. She has an area that makes her concerned is hard and not with some scaling right above a varicose vein.  She wonders if taking a baby aspirin would be of any help prevent heart attacks and strokes   Review of Systems  Constitutional: Negative for activity change, fatigue and fever.  HENT: Negative for congestion.   Respiratory: Negative for cough, chest tightness and shortness of breath.   Cardiovascular: Negative for chest pain and leg swelling.  Gastrointestinal: Negative for abdominal pain.  Skin: Negative for color change.  Neurological: Negative for headaches.  Psychiatric/Behavioral: Negative for behavioral problems.       Objective:   Physical Exam  Constitutional: She appears well-developed and well-nourished. No distress.  HENT:  Head: Normocephalic and atraumatic.  Eyes: Right eye exhibits no discharge. Left eye exhibits no discharge.  Neck: No tracheal deviation present.  Cardiovascular: Normal rate, regular rhythm and normal heart sounds.   No murmur heard. Pulmonary/Chest: Effort normal and breath sounds normal. No respiratory distress. She has no wheezes. She has no rales.  Musculoskeletal: She exhibits no edema.  Lymphadenopathy:    She has no cervical adenopathy.  Neurological: She is alert. She exhibits normal muscle tone.  Skin: Skin is warm and dry. No erythema.  Psychiatric: Her behavior is normal.  Vitals reviewed.  Varicose vein on  the lower leg on her left side has a nodule on it that has crusting and inflammation       Assessment & Plan:  I recommend she get her skin nodule checked out by dermatologists if they feel it's absolutely fine next step would be vascular surgeon for her varicose veins  HTN blood pressure actually good control she is to follow-up in 4 months continue current medication  15 minutes spent patient greater than half in discussion of these issues  Baby aspirin not recommended because of risk of bleeding

## 2017-12-19 ENCOUNTER — Encounter: Payer: Self-pay | Admitting: Family Medicine

## 2017-12-19 ENCOUNTER — Ambulatory Visit: Payer: Medicare Other | Admitting: Family Medicine

## 2017-12-19 VITALS — BP 132/82 | Ht 67.0 in | Wt 186.6 lb

## 2017-12-19 DIAGNOSIS — I10 Essential (primary) hypertension: Secondary | ICD-10-CM | POA: Diagnosis not present

## 2017-12-19 MED ORDER — AMLODIPINE BESYLATE 2.5 MG PO TABS: 2.5000 mg | ORAL_TABLET | Freq: Every day | ORAL | 5 refills | Status: DC

## 2017-12-19 MED ORDER — PRAVASTATIN SODIUM 20 MG PO TABS: 20.0000 mg | ORAL_TABLET | Freq: Every day | ORAL | 1 refills | Status: DC

## 2017-12-19 MED ORDER — METOPROLOL TARTRATE 25 MG PO TABS: 25.0000 mg | ORAL_TABLET | Freq: Two times a day (BID) | ORAL | 5 refills | Status: DC

## 2017-12-19 MED ORDER — ALLOPURINOL 100 MG PO TABS: 100.0000 mg | ORAL_TABLET | Freq: Every day | ORAL | 1 refills | Status: DC

## 2017-12-19 NOTE — Progress Notes (Signed)
   Subjective:    Patient ID: Kristi Grant, female    DOB: 08-May-1934, 83 y.o.   MRN: [id number]  Hypertension  This is a chronic problem. The current episode started more than 1 year ago. Pertinent negatives include no chest pain. Risk factors for coronary artery disease include dyslipidemia and post-menopausal state. Treatments tried: lopressor and norvasc. The current treatment provides no improvement. There are no compliance problems.   She denies any chest tightness pressure pain denies DOE denies PND she relates compliance with her medicines and tries to watch her diet undergoing a lot of stress because her husband is dying of cancer is in hospice care  Ankles are puffy at night  Review of Systems  Constitutional: Negative for activity change, appetite change and fatigue.  HENT: Negative for congestion.   Respiratory: Negative for cough.   Cardiovascular: Negative for chest pain.  Gastrointestinal: Negative for abdominal pain.  Endocrine: Negative for polydipsia and polyphagia.  Skin: Negative for color change.  Neurological: Negative for weakness.  Psychiatric/Behavioral: Negative for confusion.       Objective:   Physical Exam  Constitutional: She appears well-developed and well-nourished. No distress.  HENT:  Head: Normocephalic and atraumatic.  Eyes: Right eye exhibits no discharge. Left eye exhibits no discharge.  Neck: No tracheal deviation present.  Cardiovascular: Normal rate, regular rhythm and normal heart sounds.  No murmur heard. Pulmonary/Chest: Effort normal and breath sounds normal. No respiratory distress. She has no wheezes. She has no rales.  Musculoskeletal: She exhibits no edema.  Lymphadenopathy:    She has no cervical adenopathy.  Neurological: She is alert. She exhibits normal muscle tone.  Skin: Skin is warm and dry. No erythema.  Psychiatric: Her behavior is normal.  Vitals reviewed.  Minimal edema in the feet I do not recommend diuretic       Assessment & Plan:  HTN- Patient was seen today as part of a visit regarding hypertension. The importance of healthy diet and regular physical activity was discussed. The importance of compliance with medications discussed. Ideal goal is to keep blood pressure low elevated levels certainly below 140/90 when possible. The patient was counseled that keeping blood pressure under control lessen his risk of heart attack, stroke, kidney failure, and early death. The importance of regular follow-ups was discussed with the patient. Low-salt diet such as DASH recommended. Regular physical activity was recommended as well. Patient was advised to keep regular follow-ups.  Refills of medication Follow-up 6 months

## 2018-01-20 ENCOUNTER — Ambulatory Visit: Payer: Medicare Other | Admitting: Family Medicine

## 2018-03-24 ENCOUNTER — Other Ambulatory Visit: Payer: Self-pay | Admitting: Family Medicine

## 2018-06-10 ENCOUNTER — Telehealth: Payer: Self-pay | Admitting: Family Medicine

## 2018-06-10 NOTE — Telephone Encounter (Signed)
Pt has wellness/chronic OV here 06/19/18 - she wonders if she needs lab work  Please advise & call pt

## 2018-06-10 NOTE — Telephone Encounter (Signed)
Last labs placed on 07/15/2017 were Uric Acid, BMP, Lipid, and Hepatic.

## 2018-06-11 NOTE — Telephone Encounter (Signed)
Lipid liver, metabolic 7, CBC-hyperlipidemia, fatigue

## 2018-06-12 ENCOUNTER — Other Ambulatory Visit: Payer: Self-pay | Admitting: Family Medicine

## 2018-06-12 DIAGNOSIS — I10 Essential (primary) hypertension: Secondary | ICD-10-CM

## 2018-06-12 DIAGNOSIS — Z0000 Encounter for general adult medical examination without abnormal findings: Secondary | ICD-10-CM

## 2018-06-12 DIAGNOSIS — E7849 Other hyperlipidemia: Secondary | ICD-10-CM

## 2018-06-12 DIAGNOSIS — R5383 Other fatigue: Secondary | ICD-10-CM

## 2018-06-12 NOTE — Telephone Encounter (Signed)
Labs placed and patient notified.

## 2018-06-17 LAB — HEPATIC FUNCTION PANEL
ALT: 14 IU/L (ref 0–32)
AST: 19 IU/L (ref 0–40)
Albumin: 4.4 g/dL (ref 3.5–4.7)
Alkaline Phosphatase: 72 IU/L (ref 39–117)
BILIRUBIN TOTAL: 0.4 mg/dL (ref 0.0–1.2)
BILIRUBIN, DIRECT: 0.1 mg/dL (ref 0.00–0.40)
Total Protein: 6.9 g/dL (ref 6.0–8.5)

## 2018-06-17 LAB — BASIC METABOLIC PANEL
BUN / CREAT RATIO: 21 (ref 12–28)
BUN: 16 mg/dL (ref 8–27)
CALCIUM: 9.5 mg/dL (ref 8.7–10.3)
CHLORIDE: 104 mmol/L (ref 96–106)
CO2: 26 mmol/L (ref 20–29)
Creatinine, Ser: 0.77 mg/dL (ref 0.57–1.00)
GFR calc Af Amer: 84 mL/min/{1.73_m2} (ref >59)
GFR calc non Af Amer: 73 mL/min/{1.73_m2} (ref >59)
GLUCOSE: 92 mg/dL (ref 65–99)
Potassium: 3.9 mmol/L (ref 3.5–5.2)
Sodium: 142 mmol/L (ref 134–144)

## 2018-06-17 LAB — CBC WITH DIFFERENTIAL/PLATELET
BASOS ABS: 0 10*3/uL (ref 0.0–0.2)
Basos: 0 %
EOS (ABSOLUTE): 0.1 10*3/uL (ref 0.0–0.4)
Eos: 2 %
HEMOGLOBIN: 12.2 g/dL (ref 11.1–15.9)
Hematocrit: 36.5 % (ref 34.0–46.6)
IMMATURE GRANS (ABS): 0 10*3/uL (ref 0.0–0.1)
IMMATURE GRANULOCYTES: 0 %
Lymphocytes Absolute: 2.5 10*3/uL (ref 0.7–3.1)
Lymphs: 61 %
MCH: 31 pg (ref 26.6–33.0)
MCHC: 33.4 g/dL (ref 31.5–35.7)
MCV: 93 fL (ref 79–97)
Monocytes Absolute: 0.2 10*3/uL (ref 0.1–0.9)
Monocytes: 5 %
Neutrophils Absolute: 1.3 10*3/uL — ABNORMAL LOW (ref 1.4–7.0)
Neutrophils: 32 %
PLATELETS: 280 10*3/uL (ref 150–450)
RBC: 3.93 x10E6/uL (ref 3.77–5.28)
RDW: 15.3 % (ref 12.3–15.4)
WBC: 4.1 10*3/uL (ref 3.4–10.8)

## 2018-06-17 LAB — LIPID PANEL
CHOLESTEROL TOTAL: 200 mg/dL — AB (ref 100–199)
Chol/HDL Ratio: 3.9 ratio (ref 0.0–4.4)
HDL: 51 mg/dL (ref >39)
LDL CALC: 131 mg/dL — AB (ref 0–99)
Triglycerides: 90 mg/dL (ref 0–149)
VLDL CHOLESTEROL CAL: 18 mg/dL (ref 5–40)

## 2018-06-19 ENCOUNTER — Ambulatory Visit: Payer: Medicare Other | Admitting: Family Medicine

## 2018-06-19 ENCOUNTER — Encounter: Payer: Self-pay | Admitting: Family Medicine

## 2018-06-19 VITALS — BP 150/80 | Ht 67.0 in | Wt 187.0 lb

## 2018-06-19 DIAGNOSIS — E7849 Other hyperlipidemia: Secondary | ICD-10-CM | POA: Diagnosis not present

## 2018-06-19 DIAGNOSIS — Z0000 Encounter for general adult medical examination without abnormal findings: Secondary | ICD-10-CM

## 2018-06-19 DIAGNOSIS — I10 Essential (primary) hypertension: Secondary | ICD-10-CM

## 2018-06-19 NOTE — Progress Notes (Signed)
Subjective:    Patient ID: Kristi LopesMary P Balandran, female    DOB: 09-11-1934, 84 y.o.   MRN: [id number]  HPI AWV- Annual Wellness Visit  The patient was seen for their annual wellness visit. The patient's past medical history, surgical history, and family history were reviewed. Pertinent vaccines were reviewed ( tetanus, pneumonia, shingles, flu) The patient's medication list was reviewed and updated.  The height and weight were entered.  BMI recorded in electronic record elsewhere  Cognitive screening was completed. Outcome of Mini - Cog: passed   Falls /depression screening electronically recorded within record elsewhere No  Current tobacco usage No (All patients who use tobacco were given written and verbal information on quitting)  Recent listing of emergency department/hospitalizations over the past year were reviewed.  current specialist the patient sees on a regular basis: Dr. Chrissie NoaWilliam Ross,Dr. Vickey Hugerhristine McQueen and Dermatologist.   Medicare annual wellness visit patient questionnaire was reviewed.  A written screening schedule for the patient for the next 5-10 years was given. Appropriate discussion of followup regarding next visit was discussed.    Patient for blood pressure check up.  The patient does have hypertension.  The patient is on medication.  Patient relates compliance with meds. Todays BP reviewed with the patient. Patient denies issues with medication. Patient relates reasonable diet. Patient tries to minimize salt. Patient aware of BP goals.  Patient here for follow-up regarding cholesterol.  The patient does have hyperlipidemia.  Patient does try to maintain a reasonable diet.  Patient does take the medication on a regular basis.  Denies missing a dose.  The patient denies any obvious side effects.  Prior blood work results reviewed with the patient.  The patient is aware of his cholesterol goals and the need to keep it under good control to lessen the risk of  disease.   Review of Systems  Constitutional: Negative for activity change, appetite change and fatigue.  HENT: Negative for congestion and rhinorrhea.   Eyes: Negative for discharge.  Respiratory: Negative for cough, chest tightness and wheezing.   Cardiovascular: Negative for chest pain.  Gastrointestinal: Negative for abdominal pain, blood in stool and vomiting.  Endocrine: Negative for polyphagia.  Genitourinary: Negative for difficulty urinating and frequency.  Musculoskeletal: Negative for neck pain.  Skin: Negative for color change.  Allergic/Immunologic: Negative for environmental allergies and food allergies.  Neurological: Negative for weakness and headaches.  Psychiatric/Behavioral: Negative for agitation and behavioral problems.       Objective:   Physical Exam  Constitutional: She is oriented to person, place, and time. She appears well-developed and well-nourished.  HENT:  Head: Normocephalic.  Right Ear: External ear normal.  Left Ear: External ear normal.  Eyes: Pupils are equal, round, and reactive to light.  Neck: Normal range of motion. No thyromegaly present.  Cardiovascular: Normal rate, regular rhythm, normal heart sounds and intact distal pulses.  No murmur heard. Pulmonary/Chest: Effort normal and breath sounds normal. No respiratory distress. She has no wheezes.  Abdominal: Soft. Bowel sounds are normal. She exhibits no distension and no mass. There is no tenderness.  Musculoskeletal: Normal range of motion. She exhibits no edema or tenderness.  Lymphadenopathy:    She has no cervical adenopathy.  Neurological: She is alert and oriented to person, place, and time. She exhibits normal muscle tone.  Skin: Skin is warm and dry.  Psychiatric: She has a normal mood and affect. Her behavior is normal.          Assessment &  Plan:  Adult wellness-complete.wellness physical was conducted today. Importance of diet and exercise were discussed in detail.    In addition to this a discussion regarding safety was also covered. We also reviewed over immunizations and gave recommendations regarding current immunization needed for age.  In addition to this additional areas were also touched on including: Preventative health exams needed:  Colonoscopy not indicated  Patient was advised yearly wellness exam  Cholesterol discussed-patient tolerates pravastatin 20 mg does not tolerate it more often or a higher dose therefore just stick with 20 mg daily She will try to do more physical activity continue her blood pressure medicine we will not increase a dose of the medicine currently she will watch diet closely we will do a follow-up within 6 months Blood pressure discussed Stable on both Follow-up within 6 months

## 2018-06-20 ENCOUNTER — Other Ambulatory Visit: Payer: Self-pay | Admitting: Family Medicine

## 2018-07-22 ENCOUNTER — Other Ambulatory Visit: Payer: Self-pay | Admitting: Family Medicine

## 2018-08-19 ENCOUNTER — Other Ambulatory Visit: Payer: Self-pay | Admitting: Family Medicine

## 2018-09-02 ENCOUNTER — Encounter: Payer: Self-pay | Admitting: Family Medicine

## 2018-09-02 ENCOUNTER — Ambulatory Visit: Payer: Medicare Other | Admitting: Family Medicine

## 2018-09-02 VITALS — BP 132/70 | Temp 99.1°F | Ht 67.0 in | Wt 187.0 lb

## 2018-09-02 DIAGNOSIS — R040 Epistaxis: Secondary | ICD-10-CM

## 2018-09-02 DIAGNOSIS — I10 Essential (primary) hypertension: Secondary | ICD-10-CM | POA: Diagnosis not present

## 2018-09-02 NOTE — Progress Notes (Signed)
   Subjective:    Patient ID: Kristi Grant, female    DOB: 1934/04/10, 84 y.o.   MRN: [id number]  HPI  Patient is here today with complaints of nose bleed.She states she had one nose bleed last week and one this week. She states she has had no headaches and was not able to check her bp at the time. Patient is concerned her blood pressure was up when she checked with her cuff it was up She also relates that the nosebleeds stopped after for 5 minutes after putting some pressure on it Denies any other particular bleeding issues She has a sore on right foot middle toe. She relates that she has noticed that the toes become sore she is not sure why there is no sign of swelling there is some soreness around the nail Review of Systems  Constitutional: Negative for activity change and fever.  HENT: Positive for nosebleeds. Negative for congestion, ear pain and rhinorrhea.   Eyes: Negative for discharge.  Respiratory: Negative for cough, shortness of breath and wheezing.   Cardiovascular: Negative for chest pain.       Objective:   Physical Exam Lungs clear respiratory rate normal heart regular no murmurs blood pressure was checked several times with her cuff and my cuff her cuff overestimates her blood pressure our cuff is much more accurate There is a punctate area on the left nasal mucosa region that is responsible for the bleeding it is not bleeding currently Also the middle toe of the right foot the nail is starting to separate its based upon how she is walking no sign of infection       Assessment & Plan:  HTN decent control watch diet stay active  Nosebleeds I showed her the proper way to get them to stop if they keep happening she needs to let us know we will set her up with ENT for cauterization in addition to this saline nasal spray along with humidifier  Vaseline at nighttime  Toenail if it continues to give trouble she will need to see podiatry to have it removed

## 2018-09-18 ENCOUNTER — Other Ambulatory Visit: Payer: Self-pay | Admitting: Family Medicine

## 2018-09-24 ENCOUNTER — Other Ambulatory Visit: Payer: Self-pay | Admitting: Family Medicine

## 2018-10-08 ENCOUNTER — Encounter: Payer: Self-pay | Admitting: Family Medicine

## 2018-10-08 ENCOUNTER — Ambulatory Visit: Payer: Medicare Other | Admitting: Family Medicine

## 2018-10-08 VITALS — BP 158/72 | Temp 98.3°F | Ht 67.0 in | Wt 183.0 lb

## 2018-10-08 DIAGNOSIS — K5792 Diverticulitis of intestine, part unspecified, without perforation or abscess without bleeding: Secondary | ICD-10-CM | POA: Diagnosis not present

## 2018-10-08 DIAGNOSIS — R109 Unspecified abdominal pain: Secondary | ICD-10-CM | POA: Diagnosis not present

## 2018-10-08 DIAGNOSIS — R300 Dysuria: Secondary | ICD-10-CM | POA: Diagnosis not present

## 2018-10-08 LAB — POCT URINALYSIS DIPSTICK
LEUKOCYTES UA: NEGATIVE
RBC UA: NEGATIVE
Spec Grav, UA: <=1.005 — AB (ref 1.010–1.025)
pH, UA: 7 (ref 5.0–8.0)

## 2018-10-08 MED ORDER — CIPROFLOXACIN HCL 500 MG PO TABS: 500.0000 mg | ORAL_TABLET | Freq: Two times a day (BID) | ORAL | 0 refills | Status: DC

## 2018-10-08 MED ORDER — METRONIDAZOLE 500 MG PO TABS: 500.0000 mg | ORAL_TABLET | Freq: Three times a day (TID) | ORAL | 0 refills | Status: DC

## 2018-10-08 NOTE — Progress Notes (Signed)
   Subjective:    Patient ID: Kristi Grant, female    DOB: 12-22-1934, 84 y.o.   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 Lower abdominal discomfort Aching burning Denies dysuria No rectal bleeding Bowels are moving no nausea Pain when it is very intense Multiple times per day and some at night Is never had this problem before on previous exams has had UTI has also had diverticula noted on a colonoscopy Review of Systems  Constitutional: Negative for activity change, fatigue and fever.  HENT: Negative for congestion, ear pain and rhinorrhea.   Eyes: Negative for discharge.  Respiratory: Negative for cough, shortness of breath and wheezing.   Cardiovascular: Negative for chest pain.  Gastrointestinal: Positive for abdominal pain. Negative for blood in stool, constipation, diarrhea, rectal pain and vomiting.  Genitourinary: Negative for dysuria, frequency and hematuria.   Results for orders placed or performed in visit on 10/08/18  POCT urinalysis dipstick  Result Value Ref Range   Color, UA     Clarity, UA     Glucose, UA     Bilirubin, UA     Ketones, UA     Spec Grav, UA <=1.005 (A) 1.010 - 1.025   Blood, UA Negative    pH, UA 7.0 5.0 - 8.0   Protein, UA     Urobilinogen, UA     Nitrite, UA     Leukocytes, UA Negative Negative   Appearance     Odor         Objective:   Physical Exam  Constitutional: She appears well-nourished. No distress.  HENT:  Head: Normocephalic.  Cardiovascular: Normal rate, regular rhythm and normal heart sounds.  No murmur heard. Pulmonary/Chest: Effort normal and breath sounds normal.  Abdominal: She exhibits no mass. There is tenderness. There is no guarding.  Musculoskeletal: She exhibits no edema.  Lymphadenopathy:    She has no cervical adenopathy.  Neurological: She is alert.  Psychiatric: Her behavior is normal.  Vitals  reviewed.     Patient does not appear to toxic she does have lower abdominal tenderness worse in the left lower quadrant urinalysis under the microscope looks good    Assessment & Plan:  I doubt UTI More than likely diverticulitis Very important to get going on antibiotics Side effects discussed Follow-up in 48 hours If progressive symptoms high fevers or worse follow-up immediately Recheck if any particular problems Hold off on lab work and CAT scan at this point although if symptoms get worse we will initiate that path

## 2018-10-08 NOTE — Patient Instructions (Signed)

## 2018-10-10 ENCOUNTER — Ambulatory Visit: Payer: Medicare Other | Admitting: Family Medicine

## 2018-10-10 ENCOUNTER — Encounter: Payer: Self-pay | Admitting: Family Medicine

## 2018-10-10 VITALS — BP 138/70 | Ht 67.0 in | Wt 184.0 lb

## 2018-10-10 DIAGNOSIS — K5792 Diverticulitis of intestine, part unspecified, without perforation or abscess without bleeding: Secondary | ICD-10-CM | POA: Diagnosis not present

## 2018-10-10 LAB — SPECIMEN STATUS REPORT

## 2018-10-10 LAB — URINE CULTURE

## 2018-10-10 NOTE — Progress Notes (Signed)
   Subjective:    Patient ID: Kristi Grant, female    DOB: 06-12-1934, 84 y.o.   MRN: [id number]  HPI Patient is here today to follow up on her diverticulitis. Patient was seen the other day with left lower abdominal pain and also mid lower abdominal pain but urine looks negative more than likely diverticulitis based upon history of her having a colonoscopy which showed diverticulitis because of the pain and discomfort she does need follow-up so she is here today for follow-up she is tolerating her antibiotics she is eating she has not had a bowel movement in the past several days denies any blood in her stools no fever or chills PMH benign  Review of Systems  Constitutional: Negative for activity change, appetite change and fatigue.  HENT: Negative for congestion.   Respiratory: Negative for cough.   Cardiovascular: Negative for chest pain.  Gastrointestinal: Positive for abdominal pain and constipation.  Skin: Negative for color change.  Neurological: Negative for headaches.  Psychiatric/Behavioral: Negative for behavioral problems.       Objective:   Physical Exam  Constitutional: She appears well-nourished. No distress.  HENT:  Head: Normocephalic and atraumatic.  Eyes: Right eye exhibits no discharge. Left eye exhibits no discharge.  Neck: No tracheal deviation present.  Cardiovascular: Normal rate, regular rhythm and normal heart sounds.  No murmur heard. Pulmonary/Chest: Effort normal and breath sounds normal. No respiratory distress.  Abdominal: There is tenderness. There is no guarding.  Musculoskeletal: She exhibits no edema.  Lymphadenopathy:    She has no cervical adenopathy.  Neurological: She is alert. Coordination normal.  Skin: Skin is warm and dry.  Psychiatric: She has a normal mood and affect. Her behavior is normal.  Vitals reviewed. Minimal lower abdominal tenderness  Patient actually doing much better compared to what she was doing a couple days ago we  did talk about ways to help promote a bowel movement      Assessment & Plan:  Probable diverticulitis No need for lab work or CT scan Warning signs discussed in detail Follow-up next week Continue antibiotics No need for CAT scan or labs currently if worse to follow-up immediately here or ER

## 2018-10-15 ENCOUNTER — Ambulatory Visit: Payer: Medicare Other | Admitting: Family Medicine

## 2018-10-15 ENCOUNTER — Encounter: Payer: Self-pay | Admitting: Family Medicine

## 2018-10-15 VITALS — BP 128/82 | Ht 67.0 in | Wt 182.6 lb

## 2018-10-15 DIAGNOSIS — K5792 Diverticulitis of intestine, part unspecified, without perforation or abscess without bleeding: Secondary | ICD-10-CM

## 2018-10-15 NOTE — Progress Notes (Signed)
   Subjective:    Patient ID: Kristi Grant, female    DOB: [birth year], 84 y.o.   MRN: [id number]  HPI  Patient arrives for a follow up on diverticulitis. Patient states she is feeling better. She states she is tolerated medicine well no diarrhea issues no fever chills or sweats no abdominal pain. Review of Systems Negative for fever chills sweats shortness of breath chest pain abdominal pain rectal bleeding    Objective:   Physical Exam  Lungs clear respiratory rate normal heart regular no murmurs abdomen soft no guarding rebound or tenderness      Assessment & Plan:  Diverticular disease doing much much better now finish out antibiotics no need for any other type of testing or medications

## 2018-11-14 ENCOUNTER — Other Ambulatory Visit: Payer: Self-pay | Admitting: Family Medicine

## 2018-12-08 ENCOUNTER — Telehealth: Payer: Self-pay | Admitting: Family Medicine

## 2018-12-08 NOTE — Telephone Encounter (Signed)
Pt called in requesting appt for UTI symptoms x 4-5 days she has taken AZO for 2 days and nothing is helping. We are booked for today I spoke with a nurse who advised urgent care or ER pt states she will hang in there she does not want to go to urgent care or ER.

## 2018-12-08 NOTE — Telephone Encounter (Signed)
Nurses please talk with patient We can do 1 of a couple things I can see her tomorrow Or I can send her in an antibiotic and treat presumptively Please find out if she running any fevers?

## 2018-12-08 NOTE — Telephone Encounter (Signed)
Contacted patient. Pt is not running any fevers. Pt states she would be glad to come in tomorrow. Transferred patient  up front to get appointment

## 2018-12-08 NOTE — Telephone Encounter (Signed)
Please advise. Thank you

## 2018-12-09 ENCOUNTER — Ambulatory Visit: Payer: Medicare Other | Admitting: Family Medicine

## 2018-12-09 ENCOUNTER — Encounter: Payer: Self-pay | Admitting: Family Medicine

## 2018-12-09 VITALS — BP 144/84 | Temp 98.5°F | Ht 67.0 in | Wt 178.6 lb

## 2018-12-09 DIAGNOSIS — N3000 Acute cystitis without hematuria: Secondary | ICD-10-CM | POA: Diagnosis not present

## 2018-12-09 DIAGNOSIS — R300 Dysuria: Secondary | ICD-10-CM | POA: Diagnosis not present

## 2018-12-09 LAB — POCT URINALYSIS DIPSTICK
PH UA: 6 (ref 5.0–8.0)
Protein, UA: POSITIVE — AB
SPEC GRAV UA: 1.02 (ref 1.010–1.025)

## 2018-12-09 MED ORDER — NITROFURANTOIN MONOHYD MACRO 100 MG PO CAPS: 100.0000 mg | ORAL_CAPSULE | Freq: Two times a day (BID) | ORAL | 0 refills | Status: DC

## 2018-12-09 NOTE — Patient Instructions (Signed)

## 2018-12-09 NOTE — Progress Notes (Signed)
   Subjective:    Patient ID: Kristi LopesMary P Bellerose, female    DOB: [birth year], 84 y.o.   MRN: [id number]  Urinary Tract Infection   This is a new problem. The current episode started in the past 7 days. The problem occurs intermittently. Associated symptoms include frequency. Associated symptoms comments: Bladder spasms, feeling of having to go but not doing anything when going to bathroom, when pt does go she does have "strange sensation". She has tried increased fluids (AZO) for the symptoms. The treatment provided moderate relief.   Patient with urinary pressure frequency peeing small amounts denies blood in her urine denies flank pain or high fever chills   Review of Systems  Constitutional: Negative for activity change, appetite change and fatigue.  HENT: Negative for congestion.   Respiratory: Negative for cough.   Cardiovascular: Negative for chest pain.  Gastrointestinal: Negative for abdominal pain.  Genitourinary: Positive for difficulty urinating, dysuria and frequency.  Skin: Negative for color change.  Neurological: Negative for headaches.  Psychiatric/Behavioral: Negative for behavioral problems.       Objective:   Physical Exam Vitals signs reviewed.  Constitutional:      General: She is not in acute distress. HENT:     Head: Normocephalic and atraumatic.  Eyes:     General:        Right eye: No discharge.        Left eye: No discharge.  Neck:     Trachea: No tracheal deviation.  Cardiovascular:     Rate and Rhythm: Normal rate and regular rhythm.     Heart sounds: Normal heart sounds. No murmur.  Pulmonary:     Effort: Pulmonary effort is normal. No respiratory distress.     Breath sounds: Normal breath sounds.  Lymphadenopathy:     Cervical: No cervical adenopathy.  Skin:    General: Skin is warm and dry.  Neurological:     Mental Status: She is alert.     Coordination: Coordination normal.  Psychiatric:        Behavior: Behavior normal.             Assessment & Plan:  UTI Do not find any evidence of secondary infection going on Recommend going ahead with Macrobid twice daily for the next 7 days Culture sent Await the results If high fevers flank pain or if worse follow-up

## 2018-12-14 ENCOUNTER — Other Ambulatory Visit: Payer: Self-pay | Admitting: Family Medicine

## 2018-12-14 LAB — URINE CULTURE

## 2018-12-15 ENCOUNTER — Other Ambulatory Visit: Payer: Self-pay | Admitting: *Deleted

## 2018-12-15 MED ORDER — AMOXICILLIN-POT CLAVULANATE 875-125 MG PO TABS: 1.0000 | ORAL_TABLET | Freq: Two times a day (BID) | ORAL | 0 refills | Status: DC

## 2018-12-22 ENCOUNTER — Ambulatory Visit: Payer: Medicare Other | Admitting: Family Medicine

## 2019-01-05 ENCOUNTER — Ambulatory Visit (INDEPENDENT_AMBULATORY_CARE_PROVIDER_SITE_OTHER): Payer: Medicare Other | Admitting: Otolaryngology

## 2019-01-05 DIAGNOSIS — H903 Sensorineural hearing loss, bilateral: Secondary | ICD-10-CM

## 2019-01-21 ENCOUNTER — Other Ambulatory Visit: Payer: Self-pay

## 2019-01-21 MED ORDER — AMLODIPINE BESYLATE 2.5 MG PO TABS: 2.5000 mg | ORAL_TABLET | Freq: Every day | ORAL | 5 refills | Status: DC

## 2019-03-16 ENCOUNTER — Other Ambulatory Visit: Payer: Self-pay | Admitting: Family Medicine

## 2019-04-14 ENCOUNTER — Ambulatory Visit: Payer: Medicare Other | Admitting: Family Medicine

## 2019-05-22 ENCOUNTER — Other Ambulatory Visit: Payer: Self-pay | Admitting: Family Medicine

## 2019-05-22 NOTE — Telephone Encounter (Signed)
1 refill needs follow-up virtual visit in June

## 2019-06-22 ENCOUNTER — Other Ambulatory Visit: Payer: Self-pay | Admitting: Family Medicine

## 2019-06-22 NOTE — Telephone Encounter (Signed)
Please call and schedule a virtual appt for June then we can send in refills. Note was sent to pt last refill to schedule and none scheduled.

## 2019-06-23 NOTE — Telephone Encounter (Signed)
This +2 refills 

## 2019-06-23 NOTE — Telephone Encounter (Signed)
lvm to call and schedule virtual visit  °

## 2019-06-23 NOTE — Telephone Encounter (Signed)
Patient scheduled an appt for July 7th with Dr. Nicki Reaper but will run out of medication before then.  Please refill.

## 2019-06-30 ENCOUNTER — Ambulatory Visit (INDEPENDENT_AMBULATORY_CARE_PROVIDER_SITE_OTHER): Payer: Medicare Other | Admitting: Family Medicine

## 2019-06-30 ENCOUNTER — Other Ambulatory Visit: Payer: Self-pay

## 2019-06-30 DIAGNOSIS — M1000 Idiopathic gout, unspecified site: Secondary | ICD-10-CM

## 2019-06-30 DIAGNOSIS — I10 Essential (primary) hypertension: Secondary | ICD-10-CM | POA: Diagnosis not present

## 2019-06-30 DIAGNOSIS — E7849 Other hyperlipidemia: Secondary | ICD-10-CM | POA: Diagnosis not present

## 2019-06-30 MED ORDER — METOPROLOL TARTRATE 25 MG PO TABS: ORAL_TABLET | ORAL | 1 refills | Status: DC

## 2019-06-30 MED ORDER — PRAVASTATIN SODIUM 20 MG PO TABS: 20.0000 mg | ORAL_TABLET | Freq: Every day | ORAL | 1 refills | Status: DC

## 2019-06-30 MED ORDER — AMLODIPINE BESYLATE 2.5 MG PO TABS: 2.5000 mg | ORAL_TABLET | Freq: Every day | ORAL | 1 refills | Status: DC

## 2019-06-30 MED ORDER — ALLOPURINOL 100 MG PO TABS: 100.0000 mg | ORAL_TABLET | Freq: Every day | ORAL | 1 refills | Status: DC

## 2019-06-30 NOTE — Progress Notes (Signed)
   Subjective:    Patient ID: Kristi Grant, female    DOB: [birth year], 85 y.o.   MRN: [id number]  Hypertension This is a chronic problem. Pertinent negatives include no chest pain or shortness of breath. There are no compliance problems.   pt states she is needing a refill on BP medications since it has been a year. Pt states she does not have an accurate BP machine to check BP regularly. Pt states no issues/concerns. The only thing she has to be careful about is when she leans over and then stands up. Pt states she has had that all her life.  Virtual Visit via Telephone Note  I connected with Kristi Grant on 06/30/19 at 10:00 AM EDT by telephone and verified that I am speaking with the correct person using two identifiers.  Location: Patient: home Provider: office   I discussed the limitations, risks, security and privacy concerns of performing an evaluation and management service by telephone and the availability of in person appointments. I also discussed with the patient that there may be a patient responsible charge related to this service. The patient expressed understanding and agreed to proceed.   History of Present Illness:    Observations/Objective:   Assessment and Plan:   Follow Up Instructions:    I discussed the assessment and treatment plan with the patient. The patient was provided an opportunity to ask questions and all were answered. The patient agreed with the plan and demonstrated an understanding of the instructions.   The patient was advised to call back or seek an in-person evaluation if the symptoms worsen or if the condition fails to improve as anticipated.  I provided 15 minutes of non-face-to-face time during this encounter.   Vicente Males, LPN     Review of Systems  Constitutional: Negative for activity change, appetite change and fatigue.  HENT: Negative for congestion and rhinorrhea.   Respiratory: Negative for cough and shortness of  breath.   Cardiovascular: Negative for chest pain and leg swelling.  Gastrointestinal: Negative for abdominal pain and diarrhea.  Endocrine: Negative for polydipsia and polyphagia.  Skin: Negative for color change.  Neurological: Negative for dizziness and weakness.  Psychiatric/Behavioral: Negative for behavioral problems and confusion.       Objective:   Physical Exam   Today's visit was via telephone Physical exam was not possible for this visit      Assessment & Plan:  HTN- Patient was seen today as part of a visit regarding hypertension. The importance of healthy diet and regular physical activity was discussed. The importance of compliance with medications discussed.  Ideal goal is to keep blood pressure low elevated levels certainly below 629/52 when possible.  The patient was counseled that keeping blood pressure under control lessen his risk of complications.  The importance of regular follow-ups was discussed with the patient.  Low-salt diet such as DASH recommended.  Regular physical activity was recommended as well.  Patient was advised to keep regular follow-ups.  No gout lately  15 minutes was spent with patient today discussing healthcare issues which they came.  More than 50% of this visit-total duration of visit-was spent in counseling and coordination of care.  Please see diagnosis regarding the focus of this coordination and care Patient will come by the office in 2 days to check blood pressure

## 2019-06-30 NOTE — Addendum Note (Signed)
Addended by: Vicente Males on: 06/30/2019 01:03 PM   Modules accepted: Orders

## 2019-07-02 ENCOUNTER — Ambulatory Visit: Payer: Medicare Other | Admitting: Family Medicine

## 2019-07-02 ENCOUNTER — Encounter: Payer: Self-pay | Admitting: Family Medicine

## 2019-07-02 ENCOUNTER — Other Ambulatory Visit: Payer: Self-pay

## 2019-07-02 VITALS — BP 146/88 | Temp 96.3°F | Wt 180.8 lb

## 2019-07-02 DIAGNOSIS — I10 Essential (primary) hypertension: Secondary | ICD-10-CM

## 2019-07-02 DIAGNOSIS — R21 Rash and other nonspecific skin eruption: Secondary | ICD-10-CM

## 2019-07-02 DIAGNOSIS — E7849 Other hyperlipidemia: Secondary | ICD-10-CM | POA: Diagnosis not present

## 2019-07-02 MED ORDER — TRIAMCINOLONE ACETONIDE 0.1 % EX CREA: 1.0000 "application " | TOPICAL_CREAM | Freq: Two times a day (BID) | CUTANEOUS | 4 refills | Status: DC

## 2019-07-02 NOTE — Patient Instructions (Signed)
Take 5 mg of the amlodipine daily  That will be 2 of the 2.5 mg tablets each a.m. Keep all other the same Follow up in 2 weeks

## 2019-07-02 NOTE — Progress Notes (Signed)
   Subjective:    Patient ID: Kristi Grant, female    DOB: [birth year], 85 y.o.   MRN: [id number]  HPI Pt had telephone visit with provider on 06/30/2019. Pt mentioned that she had some bleeding behind eye and provider wanted her to come in office to check BP. Pt states that she does not have an accurate machine at home to take BP. This patient has a history of a retinal bleed as well as vascular changes related to blood pressure under the care of a retinal specialist currently Pt states that after her phone call she got into chiggers.     Review of Systems  Constitutional: Negative for activity change, fatigue and fever.  HENT: Negative for congestion and rhinorrhea.   Respiratory: Negative for cough, chest tightness and shortness of breath.   Cardiovascular: Negative for chest pain and leg swelling.  Gastrointestinal: Negative for abdominal pain and nausea.  Skin: Negative for color change.  Neurological: Negative for dizziness and headaches.  Psychiatric/Behavioral: Negative for agitation and behavioral problems.       Objective:   Physical Exam Vitals signs reviewed.  Constitutional:      General: She is not in acute distress. HENT:     Head: Normocephalic and atraumatic.  Eyes:     General:        Right eye: No discharge.        Left eye: No discharge.  Neck:     Trachea: No tracheal deviation.  Cardiovascular:     Rate and Rhythm: Normal rate and regular rhythm.     Heart sounds: Normal heart sounds. No murmur.  Pulmonary:     Effort: Pulmonary effort is normal. No respiratory distress.     Breath sounds: Normal breath sounds.  Lymphadenopathy:     Cervical: No cervical adenopathy.  Skin:    General: Skin is warm and dry.  Neurological:     Mental Status: She is alert.     Coordination: Coordination normal.  Psychiatric:        Behavior: Behavior normal.     Blood pressure readings 146/88      Assessment & Plan:  Blood pressure check multiple different  times Subpar control Go ahead and add medication Increase amlodipine New dose 5 mg daily She currently has 2.5 mg tablets she will take 2 each morning Recheck blood pressure in 2 weeks if blood pressure is improved stick with current dosing If blood pressure causes her to feel weak or woozy she is to notify us   As for the chigger bites I recommend triamcinolone cream as needed

## 2019-07-14 ENCOUNTER — Other Ambulatory Visit: Payer: Self-pay

## 2019-07-15 ENCOUNTER — Ambulatory Visit: Payer: Medicare Other | Admitting: Family Medicine

## 2019-07-15 VITALS — BP 126/82 | Temp 98.7°F | Wt 181.6 lb

## 2019-07-15 DIAGNOSIS — I10 Essential (primary) hypertension: Secondary | ICD-10-CM

## 2019-07-15 NOTE — Progress Notes (Signed)
   Subjective:    Patient ID: Kristi Grant, female    DOB: [birth year], 85 y.o.   MRN: [id number]  Hypertension This is a chronic problem. The current episode started more than 1 year ago. Pertinent negatives include no chest pain or shortness of breath. Risk factors for coronary artery disease include post-menopausal state and dyslipidemia. Treatments tried: norvasc, lopressor.  She relates several times over the past couple weeks blood pressures been on the low when causing her to feel queasy and dizzy especially when she stands she has been taking 5 mg of amlodipine rather than 2.5  She does have blood pressure related issues in her eyes including some hemorrhagic areas so therefore we will try to get blood pressure under better control    Review of Systems  Constitutional: Negative for activity change, appetite change and fatigue.  HENT: Negative for congestion and rhinorrhea.   Respiratory: Negative for cough and shortness of breath.   Cardiovascular: Negative for chest pain and leg swelling.  Gastrointestinal: Negative for abdominal pain and diarrhea.  Endocrine: Negative for polydipsia and polyphagia.  Skin: Negative for color change.  Neurological: Negative for dizziness and weakness.  Psychiatric/Behavioral: Negative for behavioral problems and confusion.       Objective:   Physical Exam Vitals signs reviewed.  Constitutional:      General: She is not in acute distress. HENT:     Head: Normocephalic and atraumatic.  Eyes:     General:        Right eye: No discharge.        Left eye: No discharge.  Neck:     Trachea: No tracheal deviation.  Cardiovascular:     Rate and Rhythm: Normal rate and regular rhythm.     Heart sounds: Normal heart sounds. No murmur.  Pulmonary:     Effort: Pulmonary effort is normal. No respiratory distress.     Breath sounds: Normal breath sounds.  Lymphadenopathy:     Cervical: No cervical adenopathy.  Skin:    General: Skin is warm and  dry.  Neurological:     Mental Status: She is alert.     Coordination: Coordination normal.  Psychiatric:        Behavior: Behavior normal.    No swelling in the legs  Blood pressure was taken with her machine with our machine Her machine the diastolic runs about 6 points higher than what it does on ours Diastolic blood pressure readings approximately 120/62     Assessment & Plan:  I believe the patient is having relative hypotension for her system and I believe that this is too strict of control She will stop the 5 mg amlodipine She will utilize a 2.5 mg amlodipine-this is what she was on previously She will send [place] some readings in a few weeks time and ideally we would like to see the readings approximately 130/80 Await to see those readings  So therefore patient will do 2.5 mg amlodipine and send [place] some readings in a couple weeks time

## 2019-07-21 LAB — HEPATIC FUNCTION PANEL
ALT: 13 IU/L (ref 0–32)
AST: 20 IU/L (ref 0–40)
Albumin: 4.9 g/dL — ABNORMAL HIGH (ref 3.6–4.6)
Alkaline Phosphatase: 72 IU/L (ref 39–117)
Bilirubin Total: 0.3 mg/dL (ref 0.0–1.2)
Bilirubin, Direct: 0.08 mg/dL (ref 0.00–0.40)
Total Protein: 7.2 g/dL (ref 6.0–8.5)

## 2019-07-21 LAB — BASIC METABOLIC PANEL
BUN/Creatinine Ratio: 23 (ref 12–28)
BUN: 24 mg/dL (ref 8–27)
CO2: 24 mmol/L (ref 20–29)
Calcium: 9.6 mg/dL (ref 8.7–10.3)
Chloride: 102 mmol/L (ref 96–106)
Creatinine, Ser: 1.05 mg/dL — ABNORMAL HIGH (ref 0.57–1.00)
GFR calc Af Amer: 57 mL/min/{1.73_m2} — ABNORMAL LOW (ref >59)
GFR calc non Af Amer: 50 mL/min/{1.73_m2} — ABNORMAL LOW (ref >59)
Glucose: 78 mg/dL (ref 65–99)
Potassium: 4.9 mmol/L (ref 3.5–5.2)
Sodium: 141 mmol/L (ref 134–144)

## 2019-07-21 LAB — URIC ACID: Uric Acid: 4.7 mg/dL (ref 2.5–7.1)

## 2019-07-21 LAB — LIPID PANEL
Chol/HDL Ratio: 4.2 ratio (ref 0.0–4.4)
Cholesterol, Total: 212 mg/dL — ABNORMAL HIGH (ref 100–199)
HDL: 50 mg/dL (ref >39)
LDL Calculated: 124 mg/dL — ABNORMAL HIGH (ref 0–99)
Triglycerides: 190 mg/dL — ABNORMAL HIGH (ref 0–149)
VLDL Cholesterol Cal: 38 mg/dL (ref 5–40)

## 2019-07-25 ENCOUNTER — Encounter: Payer: Self-pay | Admitting: Family Medicine

## 2019-08-18 ENCOUNTER — Encounter: Payer: Self-pay | Admitting: Family Medicine

## 2019-08-20 ENCOUNTER — Telehealth: Payer: Self-pay | Admitting: Family Medicine

## 2019-08-20 NOTE — Telephone Encounter (Signed)
I sent the patient a MyChart reply. She will need to stop her amlodipine  She needs to read her my chart message. I would like to do a follow-up virtual visit next week with her. This can be video or phone  If she has any further questions please let me know otherwise we will discuss further next week

## 2019-08-21 NOTE — Telephone Encounter (Signed)
Patient stated she received the my chart message and verbalized understanding. Patient scheduled virtual visit next week with Dr Nicki Reaper for follow up

## 2019-08-24 ENCOUNTER — Other Ambulatory Visit: Payer: Self-pay

## 2019-08-25 ENCOUNTER — Ambulatory Visit (INDEPENDENT_AMBULATORY_CARE_PROVIDER_SITE_OTHER): Payer: Medicare Other | Admitting: Family Medicine

## 2019-08-25 ENCOUNTER — Other Ambulatory Visit: Payer: Self-pay

## 2019-08-25 VITALS — BP 164/69

## 2019-08-25 DIAGNOSIS — I10 Essential (primary) hypertension: Secondary | ICD-10-CM

## 2019-08-25 MED ORDER — AMLODIPINE BESYLATE 2.5 MG PO TABS: 1.2500 mg | ORAL_TABLET | Freq: Every day | ORAL | 0 refills | Status: DC

## 2019-08-25 NOTE — Progress Notes (Signed)
   Subjective:    Patient ID: Kristi Grant, female    DOB: 1934/10/25, 85 y.o.   MRN: [id number] Telephone only Hypertension This is a chronic problem. The current episode started more than 1 year ago. Pertinent negatives include no chest pain, palpitations or shortness of breath. Risk factors for coronary artery disease include post-menopausal state. Treatments tried: lopressor. There are no compliance problems.    Patient states her blood pressure was running to low and her norvasc was stopped last week  On 30th 160/67 later that day 114/67 31st was 109/62 111/67 Sept 1 at 10 am 164/69  BP this am -164/69- been running between high and low  Virtual Visit via Video Note  I connected with Kristi Grant on 08/25/19 at 11:00 AM EDT by a video enabled telemedicine application and verified that I am speaking with the correct person using two identifiers.  Location: Patient: home Provider: office   I discussed the limitations of evaluation and management by telemedicine and the availability of in person appointments. The patient expressed understanding and agreed to proceed.  History of Present Illness:    Observations/Objective:   Assessment and Plan:   Follow Up Instructions:    I discussed the assessment and treatment plan with the patient. The patient was provided an opportunity to ask questions and all were answered. The patient agreed with the plan and demonstrated an understanding of the instructions.   The patient was advised to call back or seek an in-person evaluation if the symptoms worsen or if the condition fails to improve as anticipated.  I provided 15 minutes of non-face-to-face time during this encounter.      Review of Systems  Constitutional: Negative for activity change and appetite change.  HENT: Negative for congestion and rhinorrhea.   Respiratory: Negative for cough, chest tightness, shortness of breath and wheezing.   Cardiovascular: Negative for  chest pain, palpitations and leg swelling.  Gastrointestinal: Negative for abdominal pain, nausea and vomiting.  Skin: Negative for color change.  Neurological: Negative for dizziness and weakness.  Psychiatric/Behavioral: Negative for agitation and confusion.       Objective:   Physical Exam  Today's visit was via telephone Physical exam was not possible for this visit       Assessment & Plan:  Blood pressure Earlier blood pressure was too low so we stopped amlodipine now it is too high But it is typically higher in the morning and better in the evening I recommend continue metoprolol as is twice daily Restart the amlodipine 2.5 mg tablet New dose-half a tablet each evening Send [place] a MyChart message later this week Follow-up office visit in 1 week

## 2019-08-28 ENCOUNTER — Encounter: Payer: Self-pay | Admitting: Family Medicine

## 2019-09-01 ENCOUNTER — Ambulatory Visit (INDEPENDENT_AMBULATORY_CARE_PROVIDER_SITE_OTHER): Payer: Medicare Other | Admitting: Family Medicine

## 2019-09-01 ENCOUNTER — Other Ambulatory Visit: Payer: Self-pay

## 2019-09-01 ENCOUNTER — Encounter: Payer: Self-pay | Admitting: Family Medicine

## 2019-09-01 VITALS — BP 114/74

## 2019-09-01 DIAGNOSIS — I10 Essential (primary) hypertension: Secondary | ICD-10-CM | POA: Diagnosis not present

## 2019-09-01 NOTE — Progress Notes (Signed)
   Subjective:    Patient ID: Kristi Grant, female    DOB: Apr 28, 1934, 85 y.o.   MRN: [id number]  HPI Pt states her blood pressure has been running low for the past couple of days. The lowest her BP has been was 78/46; 75/44. Pt states that when her blood pressure gets low, she becomes dizzy. Dizziness occurs when she leans over or stands up; does not last long. Pt states that the lowest her BP has been since Friday was 82/54. Yesterday at 8:45 a.m.  BP was 148/72.  Virtual Visit via Video Note  I connected with Kristi Grant on 09/01/19 at 11:00 AM EDT by a video enabled telemedicine application and verified that I am speaking with the correct person using two identifiers.  Location: Patient: home Provider: office   I discussed the limitations of evaluation and management by telemedicine and the availability of in person appointments. The patient expressed understanding and agreed to proceed.  History of Present Illness:    Observations/Objective:   Assessment and Plan:   Follow Up Instructions:    I discussed the assessment and treatment plan with the patient. The patient was provided an opportunity to ask questions and all were answered. The patient agreed with the plan and demonstrated an understanding of the instructions.   The patient was advised to call back or seek an in-person evaluation if the symptoms worsen or if the condition fails to improve as anticipated.  I provided 15 minutes of non-face-to-face time during this encounter.   Vicente Males, LPN    Review of Systems  Constitutional: Negative for activity change, fatigue and fever.  HENT: Negative for congestion and rhinorrhea.   Respiratory: Negative for cough, chest tightness and shortness of breath.   Cardiovascular: Negative for chest pain and leg swelling.  Gastrointestinal: Negative for abdominal pain and nausea.  Skin: Negative for color change.  Neurological: Negative for dizziness and headaches.   Psychiatric/Behavioral: Negative for agitation and behavioral problems.       Objective:   Physical Exam  Today's visit was via telephone Physical exam was not possible for this visit   15 minutes was spent with patient today discussing healthcare issues which they came.  More than 50% of this visit-total duration of visit-was spent in counseling and coordination of care.  Please see diagnosis regarding the focus of this coordination and care     Assessment & Plan:  Blood pressure issues Unfortunately having low blood pressure related to medication We will reduce the medicine New dose half tablet metoprolol 25 mg twice daily Stick with amlodipine 2.5 mg, take half tablet daily  If blood pressures are still low toward the end of this week next step would be is stopping amlodipine allowing blood pressure to recalibrate then potentially a low-dose diuretic a few days a week  For this particular patient I would not recommend strict blood pressure control given her age top number can be into the 150s

## 2019-09-03 ENCOUNTER — Encounter: Payer: Self-pay | Admitting: Family Medicine

## 2019-09-21 ENCOUNTER — Encounter: Payer: Self-pay | Admitting: Family Medicine

## 2019-09-23 ENCOUNTER — Telehealth: Payer: Self-pay | Admitting: Family Medicine

## 2019-09-23 NOTE — Telephone Encounter (Signed)
Patient has virtual visit on 10/5

## 2019-09-23 NOTE — Telephone Encounter (Signed)
Please set up the patient next week for virtual visit for a in person visit what ever she would prefer  I sent her a MyChart message on this Reason blood pressure fluctuation

## 2019-09-28 ENCOUNTER — Ambulatory Visit (INDEPENDENT_AMBULATORY_CARE_PROVIDER_SITE_OTHER): Payer: Medicare Other | Admitting: Family Medicine

## 2019-09-28 ENCOUNTER — Other Ambulatory Visit: Payer: Self-pay

## 2019-09-28 DIAGNOSIS — I10 Essential (primary) hypertension: Secondary | ICD-10-CM

## 2019-09-28 NOTE — Progress Notes (Signed)
   Subjective:    Patient ID: Kristi Grant, female    DOB: [birth year], 85 y.o.   MRN: [id number]  HPIFollow up on blood pressure. States it has been going between 142/90 and 123/76. Pt states amlodipine was stopped end of September and metoprolol was cut down to one half bid.  We did discuss her message along with her blood pressures we also discussed her diet and activity She denies chest tightness pressure pain shortness of breath She does relate feeling washed out after activity and blood pressure does drop Later in the evening her blood pressure goes up fairly high but earlier in the day is actually on the low end she is tried even low-dose blood pressure medicines and unfortunately had very low blood pressures with them Pt got senior flu vaccine at [facility].   Virtual Visit via Telephone Note  I connected with Kristi Grant on 09/28/19 at 10:00 AM EDT by telephone and verified that I am speaking with the correct person using two identifiers.  Location: Patient: home Provider: office   I discussed the limitations, risks, security and privacy concerns of performing an evaluation and management service by telephone and the availability of in person appointments. I also discussed with the patient that there may be a patient responsible charge related to this service. The patient expressed understanding and agreed to proceed.   History of Present Illness:    Observations/Objective:   Assessment and Plan:   Follow Up Instructions:    I discussed the assessment and treatment plan with the patient. The patient was provided an opportunity to ask questions and all were answered. The patient agreed with the plan and demonstrated an understanding of the instructions.   The patient was advised to call back or seek an in-person evaluation if the symptoms worsen or if the condition fails to improve as anticipated.  I provided 18 minutes of non-face-to-face time during this encounter.    Patient denies any chest pressure or shortness of breath with activity    Review of Systems  Constitutional: Negative for activity change, fatigue and fever.  HENT: Negative for congestion and rhinorrhea.   Respiratory: Negative for cough, chest tightness and shortness of breath.   Cardiovascular: Negative for chest pain and leg swelling.  Gastrointestinal: Negative for abdominal pain and nausea.  Skin: Negative for color change.  Neurological: Negative for dizziness and headaches.  Psychiatric/Behavioral: Negative for agitation and behavioral problems.       Objective:   Physical Exam   Today's visit was via telephone Physical exam was not possible for this visit      Assessment & Plan:  Fluctuating blood pressures The majority of them look good After activity blood pressure is on the low end I would not add back any blood pressure medicine stick with the low-dose metoprolol check blood pressures intermittently and notify us in a couple weeks how blood pressures are doing Follow-up 3 months

## 2019-10-12 ENCOUNTER — Encounter: Payer: Self-pay | Admitting: Family Medicine

## 2019-12-02 ENCOUNTER — Encounter: Payer: Self-pay | Admitting: Family Medicine

## 2019-12-07 ENCOUNTER — Other Ambulatory Visit: Payer: Self-pay

## 2019-12-07 ENCOUNTER — Ambulatory Visit (INDEPENDENT_AMBULATORY_CARE_PROVIDER_SITE_OTHER): Payer: Medicare Other | Admitting: Family Medicine

## 2019-12-07 DIAGNOSIS — K5792 Diverticulitis of intestine, part unspecified, without perforation or abscess without bleeding: Secondary | ICD-10-CM | POA: Diagnosis not present

## 2019-12-07 MED ORDER — CIPROFLOXACIN HCL 500 MG PO TABS: 500.0000 mg | ORAL_TABLET | Freq: Two times a day (BID) | ORAL | 0 refills | Status: DC

## 2019-12-07 MED ORDER — METRONIDAZOLE 500 MG PO TABS: 500.0000 mg | ORAL_TABLET | Freq: Three times a day (TID) | ORAL | 0 refills | Status: DC

## 2019-12-07 MED ORDER — AMLODIPINE BESYLATE 2.5 MG PO TABS: ORAL_TABLET | ORAL | 0 refills | Status: DC

## 2019-12-07 MED ORDER — METOPROLOL TARTRATE 25 MG PO TABS: ORAL_TABLET | ORAL | 1 refills | Status: DC

## 2019-12-07 NOTE — Progress Notes (Signed)
Medication directions have been updated.

## 2019-12-07 NOTE — Progress Notes (Signed)
   Subjective:    Patient ID: Kristi Grant, female    DOB: 1934-01-14, 85 y.o.   MRN: [id number]  HPI  Patient calls with lower abdominal pain for 2 days. Patient states she has a history of diverticulitis and it feels the exact same way. Patient feels she needs antibiotic. Patient denies high fever chills sweats she does relate the pain is in the left lower quadrant no vomiting associated with this.  PMH has a history of diverticulitis. Virtual Visit via Video Note  I connected with Kristi Grant on 12/07/19 at 10:00 AM EST by a video enabled telemedicine application and verified that I am speaking with the correct person using two identifiers.  Location: Patient: home Provider: office   I discussed the limitations of evaluation and management by telemedicine and the availability of in person appointments. The patient expressed understanding and agreed to proceed.  History of Present Illness:    Observations/Objective:   Assessment and Plan:   Follow Up Instructions:    I discussed the assessment and treatment plan with the patient. The patient was provided an opportunity to ask questions and all were answered. The patient agreed with the plan and demonstrated an understanding of the instructions.   The patient was advised to call back or seek an in-person evaluation if the symptoms worsen or if the condition fails to improve as anticipated.  I provided 15 minutes of non-face-to-face time during this encounter.        Review of Systems  Constitutional: Negative for activity change, appetite change and fatigue.  HENT: Negative for congestion and rhinorrhea.   Respiratory: Negative for cough and shortness of breath.   Cardiovascular: Negative for chest pain and leg swelling.  Gastrointestinal: Positive for abdominal pain. Negative for diarrhea.  Endocrine: Negative for polydipsia and polyphagia.  Skin: Negative for color change.  Neurological: Negative for dizziness  and weakness.  Psychiatric/Behavioral: Negative for behavioral problems and confusion.       Objective:   Physical Exam  Today's visit was via telephone Physical exam was not possible for this visit  Fall Risk  09/28/2019 06/19/2018 06/19/2018 07/19/2017 07/01/2014  Falls in the past year? 0 No No No No  Follow up Falls evaluation completed - - - -        Assessment & Plan:  Blood pressure good control with current measures using half a tablet metoprolol twice daily Using half a tablet of amlodipine each evening She will give [place] follow-up over the next week  Patient more than likely has diverticulitis she would like to go ahead with antibiotics which I believe is reasonable medicines were sent in patient will give [place] feedback over the next 24 to 48 hours and do a follow-up next week if progressive symptoms or worse may need to have a scan

## 2019-12-16 ENCOUNTER — Ambulatory Visit (INDEPENDENT_AMBULATORY_CARE_PROVIDER_SITE_OTHER): Payer: Medicare Other | Admitting: Family Medicine

## 2019-12-16 ENCOUNTER — Other Ambulatory Visit: Payer: Self-pay | Admitting: Family Medicine

## 2019-12-16 ENCOUNTER — Other Ambulatory Visit: Payer: Self-pay

## 2019-12-16 DIAGNOSIS — I10 Essential (primary) hypertension: Secondary | ICD-10-CM | POA: Diagnosis not present

## 2019-12-16 MED ORDER — AMLODIPINE BESYLATE 2.5 MG PO TABS: ORAL_TABLET | ORAL | 1 refills | Status: DC

## 2019-12-16 MED ORDER — PRAVASTATIN SODIUM 20 MG PO TABS: 20.0000 mg | ORAL_TABLET | Freq: Every day | ORAL | 1 refills | Status: DC

## 2019-12-16 NOTE — Progress Notes (Signed)
   Subjective:    Patient ID: Kristi Grant, female    DOB: [birth year], 85 y.o.   MRN: [id number]  Hypertension This is a chronic problem. Pertinent negatives include no chest pain or shortness of breath. (BP has been up and down. Pt tries to take BP every 2-3 days; sometimes in the morning and sometimes at night. 12/11- 1/2 amlodipine at night along with 1/2 metoprolol BID 12/13 morning 128/69 12/17 afternoon 168/78 12/20 noon 178/81; 5 mins later 165/81 12/21 noon 166/80 12/22 morning 123/100; 5 mins later 124/71  Pt has been on 2 antibiotics for diverticulitis and pt broke out in hives Sunday night; not bad Monday no issues, Monday night pt had more hives/welps. Pt did not take last dose of Cipro. Last night pt had hives/welps very bad. Pt now has a red streak down each arm, around the lower abdomen and panty line and some on leg. Pt has on open sores just red streaks. Pt states they itch terrible and last night they were burning. ) Treatments tried: benadry for itching. The current treatment provides mild improvement. There are no compliance problems.    Virtual Visit via Telephone Note  I connected with Kristi Grant on 12/16/19 at 10:00 AM EST by telephone and verified that I am speaking with the correct person using two identifiers.  Location: Patient: home Provider: office   I discussed the limitations, risks, security and privacy concerns of performing an evaluation and management service by telephone and the availability of in person appointments. I also discussed with the patient that there may be a patient responsible charge related to this service. The patient expressed understanding and agreed to proceed.   History of Present Illness:    Observations/Objective:   Assessment and Plan:   Follow Up Instructions:    I discussed the assessment and treatment plan with the patient. The patient was provided an opportunity to ask questions and all were answered. The patient  agreed with the plan and demonstrated an understanding of the instructions.   The patient was advised to call back or seek an in-person evaluation if the symptoms worsen or if the condition fails to improve as anticipated.  I provided 15 minutes of non-face-to-face time during this encounter.   Vicente Males, LPN    Review of Systems  Constitutional: Negative for activity change, appetite change and fatigue.  HENT: Negative for congestion and rhinorrhea.   Respiratory: Negative for cough and shortness of breath.   Cardiovascular: Negative for chest pain and leg swelling.  Gastrointestinal: Negative for abdominal pain and diarrhea.  Endocrine: Negative for polydipsia and polyphagia.  Skin: Negative for color change.  Neurological: Negative for dizziness and weakness.  Psychiatric/Behavioral: Negative for behavioral problems and confusion.       Objective:   Physical Exam  Today's visit was via telephone Physical exam was not possible for this visit       Assessment & Plan:  High blood pressure Many of her readings are too high She will do the best she can add taking a full tablet daily She will send [place] updates within the next couple weeks regarding this If she has any major setbacks she will let us know  Follow-up office visit 4 to 5 months but update within 2 weeks

## 2019-12-22 ENCOUNTER — Other Ambulatory Visit: Payer: Self-pay

## 2019-12-22 ENCOUNTER — Ambulatory Visit (INDEPENDENT_AMBULATORY_CARE_PROVIDER_SITE_OTHER): Payer: Medicare Other | Admitting: Family Medicine

## 2019-12-22 DIAGNOSIS — R300 Dysuria: Secondary | ICD-10-CM | POA: Diagnosis not present

## 2019-12-22 DIAGNOSIS — N3000 Acute cystitis without hematuria: Secondary | ICD-10-CM | POA: Diagnosis not present

## 2019-12-22 MED ORDER — NITROFURANTOIN MONOHYD MACRO 100 MG PO CAPS: ORAL_CAPSULE | ORAL | 0 refills | Status: DC

## 2019-12-22 NOTE — Progress Notes (Signed)
   Subjective:    Patient ID: Kristi Grant, female    DOB: Aug 03, 1934, 85 y.o.   MRN: [id number]  Urinary Tract Infection  This is a new problem. The current episode started today (came on all of sudden). There has been no fever. Associated symptoms comments: Pain when urinating and bladder spasms. Treatments tried: AZO. The treatment provided mild relief.  pt states she was recently on Cipro for diverticulitis and that gave her hives so she would prefer not to have Cipro again. Pt states she would also need a prescription for Flagyl.   Virtual Visit via Telephone Note  I connected with Kristi Grant on 12/22/19 at 11:00 AM EST by telephone and verified that I am speaking with the correct person using two identifiers.  Location: Patient: home Provider: office   I discussed the limitations, risks, security and privacy concerns of performing an evaluation and management service by telephone and the availability of in person appointments. I also discussed with the patient that there may be a patient responsible charge related to this service. The patient expressed understanding and agreed to proceed.   History of Present Illness:    Observations/Objective:   Assessment and Plan:   Follow Up Instructions:    I discussed the assessment and treatment plan with the patient. The patient was provided an opportunity to ask questions and all were answered. The patient agreed with the plan and demonstrated an understanding of the instructions.   The patient was advised to call back or seek an in-person evaluation if the symptoms worsen or if the condition fails to improve as anticipated.  I provided 15 minutes of non-face-to-face time during this encounter.   Vicente Males, LPN  Distinct dysuria the last several days.  No fever or chills.  No vomiting no nausea.  No significant abdominal pain.  Definite increased urinary frequency.  Some element of nocturia.  Completely similar to her  prior UTIs in the past.  Also wants to report that she developed hives 5 days after starting Cipro and Flagyl empirically recently for potential diverticulitis.  See prior notes.   Review of Systems See above    Objective:   Physical Exam  Virtual      Assessment & Plan:  Impression 1 probable UTI.  Discussed.  Antibiotics prescribed has had Macrobid in the past and handled.  Symptom care discussed warning signs discussed  2.  Potential reaction to Cipro or Flagyl 5 days out from initiation of empirical therapy discussed.

## 2019-12-23 ENCOUNTER — Encounter: Payer: Self-pay | Admitting: Family Medicine

## 2020-01-11 ENCOUNTER — Other Ambulatory Visit: Payer: Self-pay | Admitting: Family Medicine

## 2020-01-20 DIAGNOSIS — L570 Actinic keratosis: Secondary | ICD-10-CM | POA: Diagnosis not present

## 2020-01-20 DIAGNOSIS — L578 Other skin changes due to chronic exposure to nonionizing radiation: Secondary | ICD-10-CM | POA: Diagnosis not present

## 2020-03-08 ENCOUNTER — Encounter: Payer: Self-pay | Admitting: Family Medicine

## 2020-03-18 DIAGNOSIS — Z961 Presence of intraocular lens: Secondary | ICD-10-CM | POA: Diagnosis not present

## 2020-03-18 DIAGNOSIS — H52203 Unspecified astigmatism, bilateral: Secondary | ICD-10-CM | POA: Diagnosis not present

## 2020-03-22 ENCOUNTER — Encounter: Payer: Self-pay | Admitting: Family Medicine

## 2020-05-12 ENCOUNTER — Encounter: Payer: Self-pay | Admitting: Family Medicine

## 2020-07-06 ENCOUNTER — Telehealth: Payer: Self-pay | Admitting: Family Medicine

## 2020-07-06 NOTE — Telephone Encounter (Signed)
Pt dropped off a note for Dr Lorin Picket placed in the green folder

## 2020-07-15 ENCOUNTER — Other Ambulatory Visit: Payer: Self-pay | Admitting: Family Medicine

## 2020-07-15 MED ORDER — PRAVASTATIN SODIUM 20 MG PO TABS: 20.0000 mg | ORAL_TABLET | Freq: Every day | ORAL | 1 refills | Status: DC

## 2020-07-15 MED ORDER — ALLOPURINOL 100 MG PO TABS: 100.0000 mg | ORAL_TABLET | Freq: Every day | ORAL | 1 refills | Status: DC

## 2020-07-15 MED ORDER — AMLODIPINE BESYLATE 2.5 MG PO TABS: ORAL_TABLET | ORAL | 1 refills | Status: DC

## 2020-07-15 NOTE — Telephone Encounter (Signed)
Nurses I spoke to the patient regarding her question Please order lipid, liver, metabolic 7, uric acid Patient needs to do her lab work in late August Please set her up in September for an office visit Thank you

## 2020-07-18 ENCOUNTER — Other Ambulatory Visit: Payer: Self-pay | Admitting: *Deleted

## 2020-07-18 DIAGNOSIS — Z79899 Other long term (current) drug therapy: Secondary | ICD-10-CM

## 2020-07-18 DIAGNOSIS — I10 Essential (primary) hypertension: Secondary | ICD-10-CM

## 2020-07-18 DIAGNOSIS — M1000 Idiopathic gout, unspecified site: Secondary | ICD-10-CM

## 2020-07-18 DIAGNOSIS — E7849 Other hyperlipidemia: Secondary | ICD-10-CM

## 2020-07-18 NOTE — Telephone Encounter (Signed)
Lab work ordered and patient scheduled an appointment in September.

## 2020-07-19 DIAGNOSIS — D485 Neoplasm of uncertain behavior of skin: Secondary | ICD-10-CM | POA: Diagnosis not present

## 2020-07-19 DIAGNOSIS — L821 Other seborrheic keratosis: Secondary | ICD-10-CM | POA: Diagnosis not present

## 2020-07-19 DIAGNOSIS — L570 Actinic keratosis: Secondary | ICD-10-CM | POA: Diagnosis not present

## 2020-07-19 DIAGNOSIS — Z85828 Personal history of other malignant neoplasm of skin: Secondary | ICD-10-CM | POA: Diagnosis not present

## 2020-07-19 DIAGNOSIS — D225 Melanocytic nevi of trunk: Secondary | ICD-10-CM | POA: Diagnosis not present

## 2020-07-19 DIAGNOSIS — L905 Scar conditions and fibrosis of skin: Secondary | ICD-10-CM | POA: Diagnosis not present

## 2020-07-19 DIAGNOSIS — D1801 Hemangioma of skin and subcutaneous tissue: Secondary | ICD-10-CM | POA: Diagnosis not present

## 2020-07-28 ENCOUNTER — Ambulatory Visit
Admission: EM | Admit: 2020-07-28 | Discharge: 2020-07-28 | Disposition: A | Discharge status: Home / Self Care | Payer: Medicare PPO | Attending: Emergency Medicine | Admitting: Emergency Medicine

## 2020-07-28 ENCOUNTER — Other Ambulatory Visit: Payer: Self-pay

## 2020-07-28 DIAGNOSIS — Z23 Encounter for immunization: Secondary | ICD-10-CM | POA: Diagnosis not present

## 2020-07-28 DIAGNOSIS — S61012A Laceration without foreign body of left thumb without damage to nail, initial encounter: Secondary | ICD-10-CM

## 2020-07-28 MED ORDER — TETANUS-DIPHTH-ACELL PERTUSSIS 5-2.5-18.5 LF-MCG/0.5 IM SUSP
0.5000 mL | Freq: Once | INTRAMUSCULAR | Status: AC
  Administered 2020-07-28: 0.5 mL via INTRAMUSCULAR

## 2020-07-28 NOTE — ED Triage Notes (Signed)
Pt has small laceration  On left thumb yesterday, bleeding controlled, needs tetanus updated

## 2020-07-28 NOTE — ED Provider Notes (Signed)
[facility]   [id number] 07/28/20 Arrival Time: 1003  CC: LACERATION  SUBJECTIVE:  Kristi Grant is a 86 y.o. female who presents with a laceration to left thumb that occurred yesterday.  Symptoms began after cutting her left thumb.  Bleeding controlled.  Currently not on blood thinners.  Denies similar symptoms in the past.  Denies fever, chills, nausea, vomiting, redness, swelling, purulent drainage, decrease strength or sensation.   Td UTD: Unknown.  ROS: As per HPI.  All other pertinent ROS negative.     Past Medical History:  Diagnosis Date  . Arthritis   . Blood transfusion without reported diagnosis   . Cataract   . Hypertension   . Neuromuscular disorder [facility])    Past Surgical History:  Procedure Laterality Date  . ABDOMINAL HYSTERECTOMY    . BASAL CELL CARCINOMA EXCISION  01/2011   nose  . Ovarian wedge resection  1962  . REPLACEMENT TOTAL KNEE Left 05/2010  . VAGINAL HYSTERECTOMY  1990   Allergies  Allergen Reactions  . Clindamycin/Lincomycin Swelling  . Ciprofloxacin Rash    Hives 5 days after  . Flagyl [Metronidazole]     Hives 5 days after  . Hydrocodone     hallucinations  . Iohexol      Code: HIVES, Desc: Pt. given 15ml Multihance for MRI scan of liver.  Pt. presented with multiple hives immediately post exam.  Pt. was seen by Radiologist who proscribed 50mg  PO Benadryl.  Pt. observed post Benadryl for 30 min. before Rad. released her., Onset Date:   . Nucynta [Tapentadol]     hallucinations  . Rocephin [Ceftriaxone Sodium In Dextrose] Hives    Delayed same day-can take amoxil   No current facility-administered medications on file prior to encounter.   Current Outpatient Medications on File Prior to Encounter  Medication Sig Dispense Refill  . allopurinol (ZYLOPRIM) 100 MG tablet Take 1 tablet (100 mg total) by mouth daily. 90 tablet 1  . amLODipine (NORVASC) 2.5 MG tablet Take 1 tablet po daily 90 tablet 1  . CALCIUM-VITAMIN D PO  Take by mouth daily.    . Coenzyme Q10 (COQ-10) 100 MG CAPS Take by mouth daily.    . hydrocortisone 2.5 % cream Apply to affected area twice daily (Patient not taking: Reported on 09/28/2019) 30 g 0  . Melatonin 1 MG TABS Take 3 mg by mouth at bedtime as needed.     . nitrofurantoin, macrocrystal-monohydrate, (MACROBID) 100 MG capsule Take one capsule po BID for 7 days 14 capsule 0  . pravastatin (PRAVACHOL) 20 MG tablet Take 1 tablet (20 mg total) by mouth daily. 90 tablet 1  . triamcinolone cream (KENALOG) 0.1 % Apply 1 application topically 2 (two) times daily. (Patient not taking: Reported on 09/28/2019) 45 g 4   Social History   Socioeconomic History  . Marital status: Married    Spouse name: Not on file  . Number of children: Not on file  . Years of education: Not on file  . Highest education level: Not on file  Occupational History  . Not on file  Tobacco Use  . Smoking status: Never Smoker  . Smokeless tobacco: Never Used  Substance and Sexual Activity  . Alcohol use: No  . Drug use: No  . Sexual activity: Not on file  Other Topics Concern  . Not on file  Social History Narrative  . Not on file   Social Determinants of Health   Financial Resource Strain:   .  Difficulty of Paying Living Expenses:   Food Insecurity:   . Worried About Programme researcher, broadcasting/film/video in the Last Year:   . Barista in the Last Year:   Transportation Needs:   . Freight forwarder (Medical):   [name] Lack of Transportation (Non-Medical):   Physical Activity:   . Days of Exercise per Week:   . Minutes of Exercise per Session:   Stress:   . Feeling of Stress :   Social Connections:   . Frequency of Communication with Friends and Family:   . Frequency of Social Gatherings with Friends and Family:   . Attends Religious Services:   . Active Member of Clubs or Organizations:   . Attends Banker Meetings:   [name] Marital Status:   Intimate Partner Violence:   . Fear of Current or  Ex-Partner:   . Emotionally Abused:   [name] Physically Abused:   . Sexually Abused:    Family History  Problem Relation Age of Onset  . Heart disease Mother   . Heart disease Father   . Breast cancer Sister   . Cirrhosis Sister   . Diabetes Sister   . Breast cancer Maternal Grandmother   . Ovarian cancer Maternal Grandmother      OBJECTIVE:  Vitals:   07/28/20 1021  BP: (!) 183/97  Pulse: 75  Resp: 16  Temp: (!) 97.5 F (36.4 C)  TempSrc: Oral  SpO2: 97%     General appearance: alert; no distress Chest: CTA, heart sounds normal Heart:: RRR, no murmur, rub or gallop Skin:  2 cm laceration of left thumb; Psychological: alert and cooperative; normal mood and affect   Results for orders placed or performed in visit on 06/30/19  Uric acid  Result Value Ref Range   Uric Acid 4.7 2.5 - 7.1 mg/dL  Basic Metabolic Panel (BMET)  Result Value Ref Range   Glucose 78 65 - 99 mg/dL   BUN 24 8 - 27 mg/dL   Creatinine, Ser 8.78 (H) 0.57 - 1.00 mg/dL   GFR calc non Af Amer 50 (L) >59 mL/min/1.73   GFR calc Af Amer 57 (L) >59 mL/min/1.73   BUN/Creatinine Ratio 23 12 - 28   Sodium 141 134 - 144 mmol/L   Potassium 4.9 3.5 - 5.2 mmol/L   Chloride 102 96 - 106 mmol/L   CO2 24 20 - 29 mmol/L   Calcium 9.6 8.7 - 10.3 mg/dL  Lipid Profile  Result Value Ref Range   Cholesterol, Total 212 (H) 100 - 199 mg/dL   Triglycerides 676 (H) 0 - 149 mg/dL   HDL 50 >72 mg/dL   VLDL Cholesterol Cal 38 5 - 40 mg/dL   LDL Calculated 094 (H) 0 - 99 mg/dL   Chol/HDL Ratio 4.2 0.0 - 4.4 ratio  Hepatic function panel  Result Value Ref Range   Total Protein 7.2 6.0 - 8.5 g/dL   Albumin 4.9 (H) 3.6 - 4.6 g/dL   Bilirubin Total 0.3 0.0 - 1.2 mg/dL   Bilirubin, Direct 7.09 0.00 - 0.40 mg/dL   Alkaline Phosphatase 72 39 - 117 IU/L   AST 20 0 - 40 IU/L   ALT 13 0 - 32 IU/L    Labs Reviewed - No data to display  No results found.  Procedure: Verbal consent obtained. Patient provided with risks  and alternatives to the procedure. Wound copiously irrigated with NS then cleansed with dermal wound cleanser.  Wound carefully explored. No foreign body,  tendon injury, or nonviable tissue were noted. Using sterile technique dermabond was applied to reapproximate the wound. Patient tolerated procedure well. No complications. Minimal bleeding. Patient advised to look for and return for any signs of infection such as redness, swelling, discharge, or worsening pain.   ASSESSMENT & PLAN:  1. Laceration of left thumb without foreign body without damage to nail, initial encounter     Meds ordered this encounter  Medications  . Tdap (BOOSTRIX) injection 0.5 mL   Discharge Instructions Tetanus immunization was given in office Dermabond was applied  Avoid submerging wound in water.   Take OTC ibuprofen or tylenol as needed for pain releif Return sooner or go to the ED if you have any new or worsening symptoms such as increased pain, redness, swelling, drainage, discharge, decreased range of motion of extremity, etc..     Reviewed expectations re: course of current medical issues. Questions answered. Outlined signs and symptoms indicating need for more acute intervention. Patient verbalized understanding. After Visit Summary given.        Note: This document was prepared using Dragon voice recognition software and may include unintentional dictation errors.    Durward Parcel, FNP 07/28/20 1109

## 2020-07-28 NOTE — Discharge Instructions (Addendum)
Tetanus immunization was given in office Dermabond was applied  Avoid submerging wound in water.   Take OTC ibuprofen or tylenol as needed for pain releif Return sooner or go to the ED if you have any new or worsening symptoms such as increased pain, redness, swelling, drainage, discharge, decreased range of motion of extremity, etc..

## 2020-08-08 DIAGNOSIS — E7849 Other hyperlipidemia: Secondary | ICD-10-CM | POA: Diagnosis not present

## 2020-08-08 DIAGNOSIS — I10 Essential (primary) hypertension: Secondary | ICD-10-CM | POA: Diagnosis not present

## 2020-08-08 DIAGNOSIS — Z79899 Other long term (current) drug therapy: Secondary | ICD-10-CM | POA: Diagnosis not present

## 2020-08-08 DIAGNOSIS — M1000 Idiopathic gout, unspecified site: Secondary | ICD-10-CM | POA: Diagnosis not present

## 2020-08-09 LAB — HEPATIC FUNCTION PANEL
ALT: 14 IU/L (ref 0–32)
AST: 22 IU/L (ref 0–40)
Albumin: 5 g/dL — ABNORMAL HIGH (ref 3.6–4.6)
Alkaline Phosphatase: 73 IU/L (ref 48–121)
Bilirubin Total: 0.4 mg/dL (ref 0.0–1.2)
Bilirubin, Direct: 0.12 mg/dL (ref 0.00–0.40)
Total Protein: 7.4 g/dL (ref 6.0–8.5)

## 2020-08-09 LAB — BASIC METABOLIC PANEL
BUN/Creatinine Ratio: 25 (ref 12–28)
BUN: 19 mg/dL (ref 8–27)
CO2: 27 mmol/L (ref 20–29)
Calcium: 9.8 mg/dL (ref 8.7–10.3)
Chloride: 101 mmol/L (ref 96–106)
Creatinine, Ser: 0.77 mg/dL (ref 0.57–1.00)
GFR calc Af Amer: 83 mL/min/{1.73_m2} (ref >59)
GFR calc non Af Amer: 72 mL/min/{1.73_m2} (ref >59)
Glucose: 84 mg/dL (ref 65–99)
Potassium: 4.8 mmol/L (ref 3.5–5.2)
Sodium: 140 mmol/L (ref 134–144)

## 2020-08-09 LAB — LIPID PANEL
Chol/HDL Ratio: 3.7 ratio (ref 0.0–4.4)
Cholesterol, Total: 210 mg/dL — ABNORMAL HIGH (ref 100–199)
HDL: 57 mg/dL (ref >39)
LDL Chol Calc (NIH): 134 mg/dL — ABNORMAL HIGH (ref 0–99)
Triglycerides: 105 mg/dL (ref 0–149)
VLDL Cholesterol Cal: 19 mg/dL (ref 5–40)

## 2020-08-09 LAB — URIC ACID: Uric Acid: 4.6 mg/dL (ref 3.1–7.9)

## 2020-09-05 ENCOUNTER — Encounter: Payer: Self-pay | Admitting: Family Medicine

## 2020-09-05 ENCOUNTER — Other Ambulatory Visit: Payer: Self-pay

## 2020-09-05 ENCOUNTER — Ambulatory Visit (INDEPENDENT_AMBULATORY_CARE_PROVIDER_SITE_OTHER): Payer: Medicare PPO | Admitting: Family Medicine

## 2020-09-05 VITALS — BP 142/90 | HR 83 | Temp 96.1°F | Wt 177.4 lb

## 2020-09-05 DIAGNOSIS — I10 Essential (primary) hypertension: Secondary | ICD-10-CM | POA: Diagnosis not present

## 2020-09-05 NOTE — Patient Instructions (Signed)
You may stop you r blood pressure medication  If your readings go significantly elevated then I would recommend utilizing half a tablet every other day  Please follow-up in the spring

## 2020-09-05 NOTE — Progress Notes (Signed)
° °  Subjective:    Patient ID: Kristi Grant, female    DOB: 1934-02-11, 86 y.o.   MRN: [id number]  HPI Pt here for follow up. Pt states blood pressure has been lower than normal. Pt states she is checking blood pressure 3 times a week but more if she feels bad.  Patient's blood pressure is often on the low end mid tend to be washed out and cause her to feel fatigued and tired she denies any other major setbacks her moods are doing good no falls recently  Review of Systems  Constitutional: Negative for activity change and appetite change.  HENT: Negative for congestion and rhinorrhea.   Respiratory: Negative for cough and shortness of breath.   Cardiovascular: Negative for chest pain and leg swelling.  Gastrointestinal: Negative for abdominal pain, nausea and vomiting.  Skin: Negative for color change.  Neurological: Negative for dizziness and weakness.  Psychiatric/Behavioral: Negative for agitation and confusion.       Objective:   Physical Exam Vitals reviewed.  Constitutional:      General: She is not in acute distress. HENT:     Head: Normocephalic.  Cardiovascular:     Rate and Rhythm: Normal rate and regular rhythm.     Heart sounds: Normal heart sounds. No murmur heard.   Pulmonary:     Effort: Pulmonary effort is normal.     Breath sounds: Normal breath sounds.  Lymphadenopathy:     Cervical: No cervical adenopathy.  Neurological:     Mental Status: She is alert.  Psychiatric:        Behavior: Behavior normal.      Fall Risk  09/28/2019 06/19/2018 06/19/2018 07/19/2017 07/01/2014  Falls in the past year? 0 No No No No  Follow up Falls evaluation completed - - - -         Assessment & Plan:  Blood pressure is an issue but it seems to be lower than it is higher so therefore stop the medicine follow-up closely let us know if numbers start creeping up will need to reinitiate medicine Currently she is taking a half of a amlodipine on a daily basis She will stop this  monitor as above If her numbers do start going up she will reinitiate a half a tablet every other day She will send [place] an update in a few weeks time Follow-up in the spring sooner problems

## 2020-11-04 ENCOUNTER — Ambulatory Visit
Admission: RE | Admit: 2020-11-04 | Discharge: 2020-11-04 | Disposition: A | Discharge status: Home / Self Care | Payer: Medicare PPO | Source: Ambulatory Visit | Attending: Emergency Medicine | Admitting: Emergency Medicine

## 2020-11-04 ENCOUNTER — Other Ambulatory Visit: Payer: Self-pay

## 2020-11-04 VITALS — BP 202/98 | HR 86 | Temp 98.0°F | Resp 19 | Ht 68.0 in | Wt 175.0 lb

## 2020-11-04 DIAGNOSIS — R030 Elevated blood-pressure reading, without diagnosis of hypertension: Secondary | ICD-10-CM | POA: Diagnosis not present

## 2020-11-04 DIAGNOSIS — N3001 Acute cystitis with hematuria: Secondary | ICD-10-CM | POA: Insufficient documentation

## 2020-11-04 LAB — POCT URINALYSIS DIP (MANUAL ENTRY)
Bilirubin, UA: NEGATIVE
Glucose, UA: NEGATIVE mg/dL
Ketones, POC UA: NEGATIVE mg/dL
Nitrite, UA: POSITIVE — AB
Protein Ur, POC: 100 mg/dL — AB
Spec Grav, UA: >=1.03 — AB (ref 1.010–1.025)
Urobilinogen, UA: 1 E.U./dL
pH, UA: 6.5 (ref 5.0–8.0)

## 2020-11-04 MED ORDER — CEPHALEXIN 500 MG PO CAPS
500.0000 mg | ORAL_CAPSULE | Freq: Two times a day (BID) | ORAL | 0 refills | Status: AC
Start: 2020-11-04 — End: 2020-11-14

## 2020-11-04 MED ORDER — PHENAZOPYRIDINE HCL 200 MG PO TABS: 200.0000 mg | ORAL_TABLET | Freq: Three times a day (TID) | ORAL | 0 refills | Status: DC

## 2020-11-04 NOTE — ED Provider Notes (Signed)
[facility]   CC: Burning with urination  SUBJECTIVE:  Kristi Grant is a 86 y.o. female who complains of dysuria, lower abdominal pressure, foul smelling urine, and bladder spasms x 1 day.  Patient denies a precipitating event, recent sexual encounter, excessive caffeine intake.   Has tried OTC medications without relief.  Symptoms are made worse with urination.  Admits to similar symptoms in the past.  Denies fever, chills, nausea, vomiting, flank pain, abnormal vaginal discharge or bleeding, hematuria.    LMP: No LMP recorded. Patient has had a hysterectomy.  ROS: As in HPI.  All other pertinent ROS negative.     Past Medical History:  Diagnosis Date  . Arthritis   . Blood transfusion without reported diagnosis   . Cataract   . Hypertension   . Neuromuscular disorder [facility])    Past Surgical History:  Procedure Laterality Date  . ABDOMINAL HYSTERECTOMY    . BASAL CELL CARCINOMA EXCISION  01/2011   nose  . CATARACT EXTRACTION    . Ovarian wedge resection  1962  . REPLACEMENT TOTAL KNEE Left 05/2010  . VAGINAL HYSTERECTOMY  1990   Allergies  Allergen Reactions  . Clindamycin/Lincomycin Swelling  . Ciprofloxacin Rash    Hives 5 days after  . Flagyl [Metronidazole]     Hives 5 days after  . Hydrocodone     hallucinations  . Iohexol      Code: HIVES, Desc: Pt. given 67ml Multihance for MRI scan of liver.  Pt. presented with multiple hives immediately post exam.  Pt. was seen by Radiologist who proscribed 50mg  PO Benadryl.  Pt. observed post Benadryl for 30 min. before Rad. released her., Onset Date:   . Nucynta [Tapentadol]     hallucinations  . Rocephin [Ceftriaxone Sodium In Dextrose] Hives    Delayed same day-can take amoxil   No current facility-administered medications on file prior to encounter.   Current Outpatient Medications on File Prior to Encounter  Medication Sig Dispense Refill  . allopurinol (ZYLOPRIM) 100 MG tablet Take 1 tablet (100  mg total) by mouth daily. 90 tablet 1  . amLODipine (NORVASC) 2.5 MG tablet Take 1 tablet po daily 90 tablet 1  . CALCIUM-VITAMIN D PO Take by mouth daily.    . Coenzyme Q10 (COQ-10) 100 MG CAPS Take by mouth daily.    . Melatonin 1 MG TABS Take 3 mg by mouth at bedtime as needed.     . Methylcobalamin (B12-ACTIVE PO) Take by mouth.    . pravastatin (PRAVACHOL) 20 MG tablet Take 1 tablet (20 mg total) by mouth daily. 90 tablet 1   Social History   Socioeconomic History  . Marital status: Married    Spouse name: Not on file  . Number of children: Not on file  . Years of education: Not on file  . Highest education level: Not on file  Occupational History  . Not on file  Tobacco Use  . Smoking status: Never Smoker  . Smokeless tobacco: Never Used  Substance and Sexual Activity  . Alcohol use: No  . Drug use: No  . Sexual activity: Not on file  Other Topics Concern  . Not on file  Social History Narrative  . Not on file   Social Determinants of Health   Financial Resource Strain:   . Difficulty of Paying Living Expenses: Not on file  Food Insecurity:   . Worried About [id number] in the Last Year: Not on file  .  Ran Out of Food in the Last Year: Not on file  Transportation Needs:   . Lack of Transportation (Medical): Not on file  . Lack of Transportation (Non-Medical): Not on file  Physical Activity:   . Days of Exercise per Week: Not on file  . Minutes of Exercise per Session: Not on file  Stress:   . Feeling of Stress : Not on file  Social Connections:   . Frequency of Communication with Friends and Family: Not on file  . Frequency of Social Gatherings with Friends and Family: Not on file  . Attends Religious Services: Not on file  . Active Member of Clubs or Organizations: Not on file  . Attends Banker Meetings: Not on file  . Marital Status: Not on file  Intimate Partner Violence:   . Fear of Current or Ex-Partner: Not on file  . Emotionally  Abused: Not on file  . Physically Abused: Not on file  . Sexually Abused: Not on file   Family History  Problem Relation Age of Onset  . Heart disease Mother   . Heart disease Father   . Breast cancer Sister   . Cirrhosis Sister   . Diabetes Sister   . Breast cancer Maternal Grandmother   . Ovarian cancer Maternal Grandmother     OBJECTIVE:  Vitals:   11/04/20 0844 11/04/20 0846 11/04/20 0848  BP:   (!) 202/98  Pulse: 86    Resp: 19    Temp: 98 F (36.7 C)    TempSrc: Oral    SpO2: 97%    Weight:  175 lb (79.4 kg)   Height:  5\' 8"  (1.727 m)    General appearance: Alert in no acute distress HEENT: NCAT.  Oropharynx clear.  Lungs: clear to auscultation bilaterally without adventitious breath sounds Heart: regular rate and rhythm.   Abdomen: soft; non-distended; no tenderness; bowel sounds present; no guarding Back: no CVA tenderness Extremities: no edema; symmetrical with no gross deformities Skin: warm and dry Neurologic: Ambulates from chair to exam table without difficulty Psychological: alert and cooperative; normal mood and affect  Labs Reviewed  POCT URINALYSIS DIP (MANUAL ENTRY) - Abnormal; Notable for the following components:      Result Value   Clarity, UA cloudy (*)    Spec Grav, UA >=1.030 (*)    Blood, UA large (*)    Protein Ur, POC =100 (*)    Nitrite, UA Positive (*)    Leukocytes, UA Large (3+) (*)    All other components within normal limits  URINE CULTURE    ASSESSMENT & PLAN:  1. Acute cystitis with hematuria   2. Elevated blood pressure reading     Meds ordered this encounter  Medications  . cephALEXin (KEFLEX) 500 MG capsule    Sig: Take 1 capsule (500 mg total) by mouth 2 (two) times daily for 10 days.    Dispense:  20 capsule    Refill:  0    Order Specific Question:   Supervising Provider    Answer:   Eustace Moore  . phenazopyridine (PYRIDIUM) 200 MG tablet    Sig: Take 1 tablet (200 mg total) by mouth 3  (three) times daily.    Dispense:  6 tablet    Refill:  0    Order Specific Question:   Supervising Provider    Answer:   [[id number]] Eustace Moore   Urine concerning for UTI Urine culture sent.  We will call you with  the results.   Push fluids and get plenty of rest.   Take antibiotic as directed and to completion Take pyridium as prescribed and as needed for symptomatic relief Follow up with PCP if symptoms persists Return here or go to ER if you have any new or worsening symptoms such as fever, worsening abdominal pain, nausea/vomiting, flank pain, etc...  Blood pressure elevated in office.  Please recheck in 24 hours.  If it continues to be greater than 140/90 please follow up with PCP for further evaluation and management.    Outlined signs and symptoms indicating need for more acute intervention. Patient verbalized understanding. After Visit Summary given.     Rennis Harding, PA-C 11/04/20 3662

## 2020-11-04 NOTE — Discharge Instructions (Addendum)
Urine concerning for UTI Urine culture sent.  We will call you with the results.   Push fluids and get plenty of rest.   Take antibiotic as directed and to completion Take pyridium as prescribed and as needed for symptomatic relief Follow up with PCP if symptoms persists Return here or go to ER if you have any new or worsening symptoms such as fever, worsening abdominal pain, nausea/vomiting, flank pain, etc...  Blood pressure elevated in office.  Please recheck in 24 hours.  If it continues to be greater than 140/90 please follow up with PCP for further evaluation and management.

## 2020-11-04 NOTE — ED Triage Notes (Signed)
Lower abd pain. Burns with urination , bladder spasms and foul smelling urine since yesterday.

## 2020-11-06 LAB — URINE CULTURE: Culture: >=100000 — AB

## 2021-01-25 ENCOUNTER — Other Ambulatory Visit: Payer: Self-pay | Admitting: Family Medicine

## 2021-01-31 ENCOUNTER — Telehealth: Payer: Self-pay

## 2021-01-31 NOTE — Telephone Encounter (Signed)
Pt is calling made 6 month follow up she did not know if she needed blood work I do not see none ordered or is she coming in for follow up on blood pressure just said 6 month follow up.  Pt call back [phone]

## 2021-01-31 NOTE — Telephone Encounter (Signed)
Last labs 08/08/2020: Lipid, Liver, Met 7 and uric acid

## 2021-01-31 NOTE — Telephone Encounter (Signed)
Lipid, met 7, uric acid Hyperlipidemia, high risk medicine, gout

## 2021-02-13 DIAGNOSIS — L218 Other seborrheic dermatitis: Secondary | ICD-10-CM | POA: Diagnosis not present

## 2021-02-13 DIAGNOSIS — D225 Melanocytic nevi of trunk: Secondary | ICD-10-CM | POA: Diagnosis not present

## 2021-02-13 DIAGNOSIS — L570 Actinic keratosis: Secondary | ICD-10-CM | POA: Diagnosis not present

## 2021-02-13 DIAGNOSIS — I788 Other diseases of capillaries: Secondary | ICD-10-CM | POA: Diagnosis not present

## 2021-02-13 DIAGNOSIS — L905 Scar conditions and fibrosis of skin: Secondary | ICD-10-CM | POA: Diagnosis not present

## 2021-02-13 DIAGNOSIS — Z85828 Personal history of other malignant neoplasm of skin: Secondary | ICD-10-CM | POA: Diagnosis not present

## 2021-03-13 ENCOUNTER — Ambulatory Visit (INDEPENDENT_AMBULATORY_CARE_PROVIDER_SITE_OTHER): Payer: Medicare PPO | Admitting: Family Medicine

## 2021-03-13 ENCOUNTER — Other Ambulatory Visit: Payer: Self-pay

## 2021-03-13 ENCOUNTER — Encounter: Payer: Self-pay | Admitting: Family Medicine

## 2021-03-13 VITALS — BP 132/80 | HR 83 | Temp 97.5°F | Ht 68.0 in | Wt 180.0 lb

## 2021-03-13 DIAGNOSIS — E7849 Other hyperlipidemia: Secondary | ICD-10-CM

## 2021-03-13 DIAGNOSIS — M1000 Idiopathic gout, unspecified site: Secondary | ICD-10-CM

## 2021-03-13 DIAGNOSIS — I10 Essential (primary) hypertension: Secondary | ICD-10-CM | POA: Diagnosis not present

## 2021-03-13 MED ORDER — ALLOPURINOL 100 MG PO TABS: 100.0000 mg | ORAL_TABLET | Freq: Every day | ORAL | 1 refills | Status: DC

## 2021-03-13 NOTE — Progress Notes (Signed)
   Subjective:    Patient ID: Kristi Grant, female    DOB: 12/10/1934, 87 y.o.   MRN: [id number]  Hypertension This is a chronic problem. Pertinent negatives include no chest pain or shortness of breath. Treatments tried: amlodipine.  goes outside and walks every day.  Brought in bp readings. Pt increased her amlodipine from one half tablet to one whole tablet this month.   Pt states no concerns today.  Her readings were gradually going up into a higher range as high as 172 now since she is on a full tablet most of them stay in a good range occasionally low     Objective:   Physical Exam Vitals reviewed.  Constitutional:      General: She is not in acute distress. HENT:     Head: Normocephalic and atraumatic.  Eyes:     General:        Right eye: No discharge.        Left eye: No discharge.  Neck:     Trachea: No tracheal deviation.  Cardiovascular:     Rate and Rhythm: Normal rate and regular rhythm.     Heart sounds: Normal heart sounds. No murmur heard.   Pulmonary:     Effort: Pulmonary effort is normal. No respiratory distress.     Breath sounds: Normal breath sounds.  Skin:    General: Skin is warm and dry.  Neurological:     Mental Status: She is alert.     Coordination: Coordination normal.  Psychiatric:        Behavior: Behavior normal.           Assessment & Plan:  Blood pressure overall good control continue current measures follow-up again in about 5 months lab work before next follow-up medications reviewed Hyperlipidemia watch diet take medication check labs History of gout check labs await results, continue medication

## 2021-03-20 ENCOUNTER — Other Ambulatory Visit: Payer: Self-pay | Admitting: Family Medicine

## 2021-03-21 DIAGNOSIS — Z23 Encounter for immunization: Secondary | ICD-10-CM | POA: Diagnosis not present

## 2021-03-22 DIAGNOSIS — H52203 Unspecified astigmatism, bilateral: Secondary | ICD-10-CM | POA: Diagnosis not present

## 2021-03-22 DIAGNOSIS — Z961 Presence of intraocular lens: Secondary | ICD-10-CM | POA: Diagnosis not present

## 2021-06-13 ENCOUNTER — Ambulatory Visit (INDEPENDENT_AMBULATORY_CARE_PROVIDER_SITE_OTHER): Payer: Medicare PPO

## 2021-06-13 ENCOUNTER — Other Ambulatory Visit: Payer: Self-pay

## 2021-06-13 DIAGNOSIS — Z0000 Encounter for general adult medical examination without abnormal findings: Secondary | ICD-10-CM

## 2021-06-13 NOTE — Patient Instructions (Signed)
Kristi Grant , Thank you for taking time to come for your Medicare Wellness Visit. I appreciate your ongoing commitment to your health goals. Please review the following plan we discussed and let me know if I can assist you in the future.   Screening recommendations/referrals: Colonoscopy: No longer required Mammogram: No longer required Bone Density: No longer required  Recommended yearly ophthalmology/optometry visit for glaucoma screening and checkup Recommended yearly dental visit for hygiene and checkup  Vaccinations: Influenza vaccine: Up to date, next due fall 2022  Pneumococcal vaccine: Completed series Tdap vaccine: Up to date, next due 07/28/2030 Shingles vaccine: Completed series     Advanced directives: Please bring in copies of your advanced medical directives so that we may scan them into your chart  Conditions/risks identified: None   Next appointment: None   Preventive Care 65 Years and Older, Female Preventive care refers to lifestyle choices and visits with your health care provider that can promote health and wellness. What does preventive care include? A yearly physical exam. This is also called an annual well check. Dental exams once or twice a year. Routine eye exams. Ask your health care provider how often you should have your eyes checked. Personal lifestyle choices, including: Daily care of your teeth and gums. Regular physical activity. Eating a healthy diet. Avoiding tobacco and drug use. Limiting alcohol use. Practicing safe sex. Taking low-dose aspirin every day. Taking vitamin and mineral supplements as recommended by your health care provider. What happens during an annual well check? The services and screenings done by your health care provider during your annual well check will depend on your age, overall health, lifestyle risk factors, and family history of disease. Counseling  Your health care provider may ask you questions about your: Alcohol  use. Tobacco use. Drug use. Emotional well-being. Home and relationship well-being. Sexual activity. Eating habits. History of falls. Memory and ability to understand (cognition). Work and work Astronomer. Reproductive health. Screening  You may have the following tests or measurements: Height, weight, and BMI. Blood pressure. Lipid and cholesterol levels. These may be checked every 5 years, or more frequently if you are over 24 years old. Skin check. Lung cancer screening. You may have this screening every year starting at age 86 if you have a 30-pack-year history of smoking and currently smoke or have quit within the past 15 years. Fecal occult blood test (FOBT) of the stool. You may have this test every year starting at age 76. Flexible sigmoidoscopy or colonoscopy. You may have a sigmoidoscopy every 5 years or a colonoscopy every 10 years starting at age 48. Hepatitis C blood test. Hepatitis B blood test. Sexually transmitted disease (STD) testing. Diabetes screening. This is done by checking your blood sugar (glucose) after you have not eaten for a while (fasting). You may have this done every 1-3 years. Bone density scan. This is done to screen for osteoporosis. You may have this done starting at age 4. Mammogram. This may be done every 1-2 years. Talk to your health care provider about how often you should have regular mammograms. Talk with your health care provider about your test results, treatment options, and if necessary, the need for more tests. Vaccines  Your health care provider may recommend certain vaccines, such as: Influenza vaccine. This is recommended every year. Tetanus, diphtheria, and acellular pertussis (Tdap, Td) vaccine. You may need a Td booster every 10 years. Zoster vaccine. You may need this after age 63. Pneumococcal 13-valent conjugate (PCV13) vaccine. One  dose is recommended after age 31. Pneumococcal polysaccharide (PPSV23) vaccine. One dose is  recommended after age 61. Talk to your health care provider about which screenings and vaccines you need and how often you need them. This information is not intended to replace advice given to you by your health care provider. Make sure you discuss any questions you have with your health care provider. Document Released: 01/06/2016 Document Revised: 08/29/2016 Document Reviewed: 10/11/2015 Elsevier Interactive Patient Education  2017 [place] Prevention in the Home Falls can cause injuries. They can happen to people of all ages. There are many things you can do to make your home safe and to help prevent falls. What can I do on the outside of my home? Regularly fix the edges of walkways and driveways and fix any cracks. Remove anything that might make you trip as you walk through a door, such as a raised step or threshold. Trim any bushes or trees on the path to your home. Use bright outdoor lighting. Clear any walking paths of anything that might make someone trip, such as rocks or tools. Regularly check to see if handrails are loose or broken. Make sure that both sides of any steps have handrails. Any raised decks and porches should have guardrails on the edges. Have any leaves, snow, or ice cleared regularly. Use sand or salt on walking paths during winter. Clean up any spills in your garage right away. This includes oil or grease spills. What can I do in the bathroom? Use night lights. Install grab bars by the toilet and in the tub and shower. Do not use towel bars as grab bars. Use non-skid mats or decals in the tub or shower. If you need to sit down in the shower, use a plastic, non-slip stool. Keep the floor dry. Clean up any water that spills on the floor as soon as it happens. Remove soap buildup in the tub or shower regularly. Attach bath mats securely with double-sided non-slip rug tape. Do not have throw rugs and other things on the floor that can make you  trip. What can I do in the bedroom? Use night lights. Make sure that you have a light by your bed that is easy to reach. Do not use any sheets or blankets that are too big for your bed. They should not hang down onto the floor. Have a firm chair that has side arms. You can use this for support while you get dressed. Do not have throw rugs and other things on the floor that can make you trip. What can I do in the kitchen? Clean up any spills right away. Avoid walking on wet floors. Keep items that you use a lot in easy-to-reach places. If you need to reach something above you, use a strong step stool that has a grab bar. Keep electrical cords out of the way. Do not use floor polish or wax that makes floors slippery. If you must use wax, use non-skid floor wax. Do not have throw rugs and other things on the floor that can make you trip. What can I do with my stairs? Do not leave any items on the stairs. Make sure that there are handrails on both sides of the stairs and use them. Fix handrails that are broken or loose. Make sure that handrails are as long as the stairways. Check any carpeting to make sure that it is firmly attached to the stairs. Fix any carpet that is loose or worn.  Avoid having throw rugs at the top or bottom of the stairs. If you do have throw rugs, attach them to the floor with carpet tape. Make sure that you have a light switch at the top of the stairs and the bottom of the stairs. If you do not have them, ask someone to add them for you. What else can I do to help prevent falls? Wear shoes that: Do not have high heels. Have rubber bottoms. Are comfortable and fit you well. Are closed at the toe. Do not wear sandals. If you use a stepladder: Make sure that it is fully opened. Do not climb a closed stepladder. Make sure that both sides of the stepladder are locked into place. Ask someone to hold it for you, if possible. Clearly mark and make sure that you can  see: Any grab bars or handrails. First and last steps. Where the edge of each step is. Use tools that help you move around (mobility aids) if they are needed. These include: Canes. Walkers. Scooters. Crutches. Turn on the lights when you go into a dark area. Replace any light bulbs as soon as they burn out. Set up your furniture so you have a clear path. Avoid moving your furniture around. If any of your floors are uneven, fix them. If there are any pets around you, be aware of where they are. Review your medicines with your doctor. Some medicines can make you feel dizzy. This can increase your chance of falling. Ask your doctor what other things that you can do to help prevent falls. This information is not intended to replace advice given to you by your health care provider. Make sure you discuss any questions you have with your health care provider. Document Released: 10/06/2009 Document Revised: 05/17/2016 Document Reviewed: 01/14/2015 Elsevier Interactive Patient Education  2017 [employer].

## 2021-06-13 NOTE — Progress Notes (Addendum)
Subjective:   Kristi Grant is a 87 y.o. female who presents for Medicare Annual (Subsequent) preventive examination.   I connected with Kristi Grant today by telephone and verified that I am speaking with the correct person using two identifiers. Location patient: home Location provider: work Persons participating in the virtual visit: patient, provider.   I discussed the limitations, risks, security and privacy concerns of performing an evaluation and management service by telephone and the availability of in person appointments. I also discussed with the patient that there may be a patient responsible charge related to this service. The patient expressed understanding and verbally consented to this telephonic visit.    Interactive audio and video telecommunications were attempted between this provider and patient, however failed, due to patient having technical difficulties OR patient did not have access to video capability.  We continued and completed visit with audio only.     Review of Systems    N/A  Cardiac Risk Factors include: advanced age (>90men, >32 women);hypertension;dyslipidemia     Objective:    Today's Vitals   There is no height or weight on file to calculate BMI.  Advanced Directives 06/13/2021 01/28/2015  Does Patient Have a Medical Advance Directive? Yes Yes  Type of Estate agent of [place];Living will -  Does patient want to make changes to medical advance directive? No - Patient declined -  Copy of Healthcare Power of Attorney in Chart? No - copy requested No - copy requested  Would patient like information on creating a medical advance directive? No - Patient declined -    Current Medications (verified) Outpatient Encounter Medications as of 06/13/2021  Medication Sig   allopurinol (ZYLOPRIM) 100 MG tablet Take 1 tablet (100 mg total) by mouth daily.   amLODipine (NORVASC) 2.5 MG tablet Take 1 tablet po daily   Coenzyme Q10 (COQ-10)  100 MG CAPS Take by mouth daily.   Methylcobalamin (B12-ACTIVE PO) Take by mouth.   OVER THE COUNTER MEDICATION Vit d daily   pravastatin (PRAVACHOL) 20 MG tablet TAKE ONE TABLET BY MOUTH ONCE DAILY.   No facility-administered encounter medications on file as of 06/13/2021.    Allergies (verified) Clindamycin/lincomycin, Ciprofloxacin, Flagyl [metronidazole], Hydrocodone, Iohexol, Nucynta [tapentadol], and Rocephin [ceftriaxone sodium in dextrose]   History: Past Medical History:  Diagnosis Date   Arthritis    Blood transfusion without reported diagnosis    Cataract    Hypertension    Neuromuscular disorder (HCC)    Past Surgical History:  Procedure Laterality Date   ABDOMINAL HYSTERECTOMY     BASAL CELL CARCINOMA EXCISION  01/2011   nose   CATARACT EXTRACTION     Ovarian wedge resection  1962   REPLACEMENT TOTAL KNEE Left 05/2010   VAGINAL HYSTERECTOMY  1990   Family History  Problem Relation Age of Onset   Heart disease Mother    Heart disease Father    Breast cancer Sister    Cirrhosis Sister    Diabetes Sister    Breast cancer Maternal Grandmother    Ovarian cancer Maternal Grandmother    Social History   Socioeconomic History   Marital status: Married    Spouse name: Not on file   Number of children: Not on file   Years of education: Not on file   Highest education level: Not on file  Occupational History   Not on file  Tobacco Use   Smoking status: Never   Smokeless tobacco: Never  Substance and Sexual Activity  Alcohol use: No   Drug use: No   Sexual activity: Not on file  Other Topics Concern   Not on file  Social History Narrative   Not on file   Social Determinants of Health   Financial Resource Strain: Low Risk    Difficulty of Paying Living Expenses: Not hard at all  Food Insecurity: No Food Insecurity   Worried About Running Out of Food in the Last Year: Never true   Ran Out of Food in the Last Year: Never true  Transportation Needs: No  Transportation Needs   Lack of Transportation (Medical): No   Lack of Transportation (Non-Medical): No  Physical Activity: Sufficiently Active   Days of Exercise per Week: 7 days   Minutes of Exercise per Session: 30 min  Stress: No Stress Concern Present   Feeling of Stress : Not at all  Social Connections: Moderately Integrated   Frequency of Communication with Friends and Family: More than three times a week   Frequency of Social Gatherings with Friends and Family: More than three times a week   Attends Religious Services: More than 4 times per year   Active Member of [employer] or Organizations: Yes   Attends Banker Meetings: More than 4 times per year   Marital Status: Widowed    Tobacco Counseling Counseling given: Not Answered   Clinical Intake:  Pre-visit preparation completed: Yes  Pain : No/denies pain     Nutritional Risks: None Diabetes: No  How often do you need to have someone help you when you read instructions, pamphlets, or other written materials from your doctor or pharmacy?: 1 - Never  Diabetic?No   Interpreter Needed?: No  Information entered by :: SCrews,LPN   Activities of Daily Living In your present state of health, do you have any difficulty performing the following activities: 06/13/2021  Hearing? N  Vision? N  Difficulty concentrating or making decisions? N  Walking or climbing stairs? N  Dressing or bathing? N  Doing errands, shopping? N  Preparing Food and eating ? N  Using the Toilet? N  In the past six months, have you accidently leaked urine? Y  Comment wears incontience pad due to bladder leakage  Do you have problems with loss of bowel control? N  Managing your Medications? N  Managing your Finances? N  Housekeeping or managing your Housekeeping? N  Some recent data might be hidden    Patient Care Team: Babs Sciara, MD as PCP - General (Family Medicine)  Indicate any recent Medical Services you may have  received from other than [facility] providers in the past year (date may be approximate).     Assessment:   This is a routine wellness examination for [facility].  Hearing/Vision screen Vision Screening - Comments:: Patient states gets eye exams once per year.   Dietary issues and exercise activities discussed: Current Exercise Habits: Home exercise routine, Type of exercise: walking, Time (Minutes): 25, Frequency (Times/Week): 7, Weekly Exercise (Minutes/Week): 175, Intensity: Mild, Exercise limited by: None identified   Goals Addressed             This Visit's Progress    Patient Stated       I would like to stay active as long as I can       Prevent falls         Depression Screen PHQ 2/9 Scores 06/13/2021 03/13/2021 03/13/2021 09/05/2020 07/19/2017 07/19/2017 07/01/2014  PHQ - 2 Score 0 0 0 0 0 0  0    Fall Risk Fall Risk  06/13/2021 03/13/2021 09/05/2020 09/28/2019 06/19/2018  Falls in the past year? 1 1 0 0 No  Number falls in past yr: 0 0 - - -  Injury with Fall? 0 1 - - -  Risk for fall due to : Orthopedic patient History of fall(s) - - -  Follow up Falls evaluation completed;Falls prevention discussed Education provided;Falls evaluation completed Falls evaluation completed Falls evaluation completed -    FALL RISK PREVENTION PERTAINING TO THE HOME:  Any stairs in or around the home? Yes  If so, are there any without handrails? No  Home free of loose throw rugs in walkways, pet beds, electrical cords, etc? Yes  Adequate lighting in your home to reduce risk of falls? Yes   ASSISTIVE DEVICES UTILIZED TO PREVENT FALLS:  Life alert? No  Use of a cane, walker or w/c? No  Grab bars in the bathroom? Yes  Shower chair or bench in shower? Yes  Elevated toilet seat or a handicapped toilet? Yes     Cognitive Function:  Normal cognitive status assessed by direct observation by this Nurse Health Advisor. No abnormalities found.        Immunizations Immunization History  Administered  Date(s) Administered   Fluad Quad(high Dose 65+) 10/20/2020   Influenza, High Dose Seasonal PF 10/03/2018, 09/23/2019   Influenza-Unspecified 10/01/2013, 10/03/2015, 10/23/2017, 10/02/2018, 09/23/2019   Moderna SARS-COV2 Booster Vaccination 03/21/2021   Moderna Sars-Covid-2 Vaccination 01/28/2020, 02/26/2020, 08/31/2020   Pneumococcal Conjugate-13 07/01/2014   Pneumococcal-Unspecified 09/23/2010   Tdap 07/28/2020   Zoster Recombinat (Shingrix) 10/16/2017, 03/18/2018   Zoster, Live 12/25/2007    TDAP status: Up to date  Flu Vaccine status: Up to date  Pneumococcal vaccine status: Up to date  Covid-19 vaccine status: Completed vaccines  Qualifies for Shingles Vaccine? Yes   Zostavax completed Yes   Shingrix Completed?: Yes  Screening Tests Health Maintenance  Topic Date Due   COVID-19 Vaccine (5 - Booster for Moderna series) 07/21/2021   INFLUENZA VACCINE  07/24/2021   TETANUS/TDAP  07/28/2030   DEXA SCAN  Completed   PNA vac Low Risk Adult  Completed   Zoster Vaccines- Shingrix  Completed   HPV VACCINES  Aged Out    Health Maintenance  There are no preventive care reminders to display for this patient.   Colorectal cancer screening: No longer required.   Mammogram status: No longer required due to age.  Bone Density status: Completed 01/30/2016. Results reflect: Bone density results: OSTEOPENIA. Repeat every 5 years.  Lung Cancer Screening: (Low Dose CT Chest recommended if Age 22-80 years, 30 pack-year currently smoking OR have quit w/in 15years.) does not qualify.   Lung Cancer Screening Referral: N/A   Additional Screening:  Hepatitis C Screening: does not qualify;   Vision Screening: Recommended annual ophthalmology exams for early detection of glaucoma and other disorders of the eye. Is the patient up to date with their annual eye exam?  Yes  Who is the provider or what is the name of the office in which the patient attends annual eye exams? Dr. Charlotte Sanes   If pt is not established with a provider, would they like to be referred to a provider to establish care? No .   Dental Screening: Recommended annual dental exams for proper oral hygiene  Community Resource Referral / Chronic Care Management: CRR required this visit?  No   CCM required this visit?  No      Plan:  I have personally reviewed and noted the following in the patient's chart:   Medical and social history Use of alcohol, tobacco or illicit drugs  Current medications and supplements including opioid prescriptions.  Functional ability and status Nutritional status Physical activity Advanced directives List of other physicians Hospitalizations, surgeries, and ER visits in previous 12 months Vitals Screenings to include cognitive, depression, and falls Referrals and appointments  In addition, I have reviewed and discussed with patient certain preventive protocols, quality metrics, and best practice recommendations. A written personalized care plan for preventive services as well as general preventive health recommendations were provided to patient.     Theodora BlowShannon Timmi Devora, LPN   9/60/45406/21/2022   Nurse Notes: None  I have reviewed and agree with the above annual wellness visit documentation Lilyan PuntScott Luking MD [facility]

## 2021-06-21 ENCOUNTER — Other Ambulatory Visit: Payer: Self-pay | Admitting: Family Medicine

## 2021-07-28 DIAGNOSIS — E7849 Other hyperlipidemia: Secondary | ICD-10-CM | POA: Diagnosis not present

## 2021-07-28 DIAGNOSIS — M1000 Idiopathic gout, unspecified site: Secondary | ICD-10-CM | POA: Diagnosis not present

## 2021-07-28 DIAGNOSIS — I10 Essential (primary) hypertension: Secondary | ICD-10-CM | POA: Diagnosis not present

## 2021-07-29 ENCOUNTER — Encounter: Payer: Self-pay | Admitting: Family Medicine

## 2021-07-29 LAB — LIPID PANEL
Chol/HDL Ratio: 3.7 ratio (ref 0.0–4.4)
Cholesterol, Total: 196 mg/dL (ref 100–199)
HDL: 53 mg/dL (ref >39)
LDL Chol Calc (NIH): 126 mg/dL — ABNORMAL HIGH (ref 0–99)
Triglycerides: 93 mg/dL (ref 0–149)
VLDL Cholesterol Cal: 17 mg/dL (ref 5–40)

## 2021-07-29 LAB — HEPATIC FUNCTION PANEL
ALT: 13 IU/L (ref 0–32)
AST: 22 IU/L (ref 0–40)
Albumin: 4.6 g/dL (ref 3.6–4.6)
Alkaline Phosphatase: 69 IU/L (ref 44–121)
Bilirubin Total: 0.4 mg/dL (ref 0.0–1.2)
Bilirubin, Direct: <0.1 mg/dL (ref 0.00–0.40)
Total Protein: 7 g/dL (ref 6.0–8.5)

## 2021-07-29 LAB — BASIC METABOLIC PANEL
BUN/Creatinine Ratio: 24 (ref 12–28)
BUN: 26 mg/dL (ref 8–27)
CO2: 22 mmol/L (ref 20–29)
Calcium: 9.8 mg/dL (ref 8.7–10.3)
Chloride: 101 mmol/L (ref 96–106)
Creatinine, Ser: 1.09 mg/dL — ABNORMAL HIGH (ref 0.57–1.00)
Glucose: 108 mg/dL — ABNORMAL HIGH (ref 65–99)
Potassium: 4.4 mmol/L (ref 3.5–5.2)
Sodium: 141 mmol/L (ref 134–144)
eGFR: 50 mL/min/{1.73_m2} — ABNORMAL LOW (ref >59)

## 2021-07-29 LAB — URIC ACID: Uric Acid: 5 mg/dL (ref 3.1–7.9)

## 2021-07-31 ENCOUNTER — Other Ambulatory Visit: Payer: Self-pay | Admitting: Family Medicine

## 2021-08-01 NOTE — Telephone Encounter (Signed)
May have 6 months follow-up this fall

## 2021-08-15 ENCOUNTER — Telehealth: Payer: Self-pay | Admitting: Family Medicine

## 2021-08-15 ENCOUNTER — Ambulatory Visit (INDEPENDENT_AMBULATORY_CARE_PROVIDER_SITE_OTHER): Payer: Medicare PPO | Admitting: Family Medicine

## 2021-08-15 ENCOUNTER — Other Ambulatory Visit: Payer: Self-pay

## 2021-08-15 DIAGNOSIS — U071 COVID-19: Secondary | ICD-10-CM

## 2021-08-15 MED ORDER — NIRMATRELVIR/RITONAVIR (PAXLOVID) TABLET (RENAL DOSING)
2.0000 | ORAL_TABLET | Freq: Two times a day (BID) | ORAL | 0 refills | Status: AC
Start: 2021-08-15 — End: 2021-08-20

## 2021-08-15 NOTE — Telephone Encounter (Signed)
Patient tested positive for Covid on yesterday at 8 pm. She has headache,cough,sorethroat,congestion no energy muscle pain. Please advise

## 2021-08-15 NOTE — Telephone Encounter (Signed)
Nurses I can do a phone visit with the patient, please add onto the schedule at 1140, phone visit will be somewhere around that time

## 2021-08-15 NOTE — Telephone Encounter (Signed)
Pt contacted. Pt states that she feels worthless, no energy, cough, headache, back and shoulder muscles, congestion, sore throat. No shortness of breath or trouble breathing. Symptoms began Sunday evening with cough, yesterday coughing and congestion and sore throat. As the day went on, she began to have fatigue and back and shoulder aches. Please advise. Thank you.   [facility] (ask to deliver)

## 2021-08-15 NOTE — Telephone Encounter (Signed)
Ms. shaindy, reader are scheduled for a virtual visit with your provider today.    Just as we do with appointments in the office, we must obtain your consent to participate.  Your consent will be active for this visit and any virtual visit you may have with one of our providers in the next 365 days.    If you have a MyChart account, I can also send a copy of this consent to you electronically.  All virtual visits are billed to your insurance company just like a traditional visit in the office.  As this is a virtual visit, video technology does not allow for your provider to perform a traditional examination.  This may limit your provider's ability to fully assess your condition.  If your provider identifies any concerns that need to be evaluated in person or the need to arrange testing such as labs, EKG, etc, we will make arrangements to do so.    Although advances in technology are sophisticated, we cannot ensure that it will always work on either your end or our end.  If the connection with a video visit is poor, we may have to switch to a telephone visit.  With either a video or telephone visit, we are not always able to ensure that we have a secure connection.   I need to obtain your verbal consent now.   Are you willing to proceed with your visit today?   JORDANNA HENDRIE has provided verbal consent on 08/15/2021 for a virtual visit (video or telephone).   Marlowe Shores, LPN 07/26/2121  48:25 AM

## 2021-08-15 NOTE — Progress Notes (Addendum)
   Subjective:    Patient ID: Kristi Grant, female    DOB: 11/09/1934, 87 y.o.   MRN: [id number] Telephone only video not possible HPI  Patient presents today with respiratory illness Number of days present-2 began Sunday  Symptoms include- cough, congestion, fatigue, headache, back and shoulder muscle pain, sore throat  Presence of worrisome signs (severe shortness of breath, lethargy, etc.) - no  Recent/current visit to urgent care or ER- no  Recent direct exposure to Covid- none known  Any current Covid testing- positive at home test last night 15 minutes was spent with the patient and additional time was spent documenting Virtual Visit via Telephone Note  I connected with Kristi Grant on 08/15/21 at 11:40 AM EDT by telephone and verified that I am speaking with the correct person using two identifiers.  Location: Patient: home Provider: office   I discussed the limitations, risks, security and privacy concerns of performing an evaluation and management service by telephone and the availability of in person appointments. I also discussed with the patient that there may be a patient responsible charge related to this service. The patient expressed understanding and agreed to proceed.   History of Present Illness:    Observations/Objective:   Assessment and Plan:   Follow Up Instructions:    I discussed the assessment and treatment plan with the patient. The patient was provided an opportunity to ask questions and all were answered. The patient agreed with the plan and demonstrated an understanding of the instructions.   The patient was advised to call back or seek an in-person evaluation if the symptoms worsen or if the condition fails to improve as anticipated.  I provided  minutes of non-face-to-face time during this encounter.      Review of Systems     Objective:   Physical Exam  Today's visit was via telephone Physical exam was not possible for this  visit       Assessment & Plan:   Paxlovid was discussed.  Patient agrees to medication and desires it.  Hold amlodipine hold pravastatin.  May take allopurinol.  5-day course was sent in with renal dosing.  Side effects discussed.  If progressive troubles shortness of breath difficulty breathing or worse go to ER call us if increasing health issues

## 2021-08-15 NOTE — Telephone Encounter (Signed)
Pt contacted and verbalized understanding. Pt placed on schedule for 11:40am

## 2021-09-15 ENCOUNTER — Encounter: Payer: Self-pay | Admitting: Nurse Practitioner

## 2021-09-15 ENCOUNTER — Other Ambulatory Visit: Payer: Self-pay | Admitting: Nurse Practitioner

## 2021-09-15 ENCOUNTER — Other Ambulatory Visit (HOSPITAL_COMMUNITY): Payer: Self-pay | Admitting: Rheumatology

## 2021-09-15 ENCOUNTER — Other Ambulatory Visit: Payer: Self-pay

## 2021-09-15 ENCOUNTER — Ambulatory Visit: Payer: Medicare PPO | Admitting: Nurse Practitioner

## 2021-09-15 ENCOUNTER — Other Ambulatory Visit (HOSPITAL_COMMUNITY): Payer: Self-pay | Admitting: Family Medicine

## 2021-09-15 VITALS — BP 143/90 | HR 91 | Temp 97.7°F | Ht 68.0 in | Wt 176.0 lb

## 2021-09-15 DIAGNOSIS — Z23 Encounter for immunization: Secondary | ICD-10-CM

## 2021-09-15 DIAGNOSIS — N644 Mastodynia: Secondary | ICD-10-CM | POA: Diagnosis not present

## 2021-09-15 MED ORDER — PRAVASTATIN SODIUM 20 MG PO TABS: 20.0000 mg | ORAL_TABLET | Freq: Every day | ORAL | 1 refills | Status: DC

## 2021-09-15 NOTE — Progress Notes (Addendum)
Subjective:    Patient ID: Kristi Grant, female    DOB: [birth year], 87 y.o.   MRN: [id number]  HPI Patient presents today for left breast pain/discomfort/tenderness with onset one week ago. Patient noticed pain in the morning after wakening and says its more discomfort than actual pain. Denies burning, swelling of the breast. Comes and goes throughout the day but does not wake her up at night. She has not tried anything to relieve the symptoms and does not recall any injury to the breast itself. No change in activity.  FMH: mother and sister breast cancer according to patient.  Mild dizziness at times over the past year with sudden position change. Has cut her Amlodipine to 1/4 pill since June which has helped.   Review of Systems  Constitutional:  Negative for activity change, chills, fatigue and fever.  Respiratory:  Negative for cough, chest tightness, shortness of breath and wheezing.   Cardiovascular:  Negative for leg swelling.  Gastrointestinal:  Negative for nausea and vomiting.  Neurological:  Negative for facial asymmetry, weakness, light-headedness and numbness.      Objective:   Physical Exam Vitals reviewed.  Constitutional:      General: She is in acute distress.     Appearance: Normal appearance.  Cardiovascular:     Rate and Rhythm: Normal rate and regular rhythm.     Heart sounds: Normal heart sounds. No murmur heard. Pulmonary:     Effort: Pulmonary effort is normal. No respiratory distress.     Breath sounds: Normal breath sounds.  Chest:  Breasts:    Right: Normal. No swelling, inverted nipple, mass, skin change or tenderness.     Left: Tenderness present. No swelling, inverted nipple, mass, nipple discharge or skin change.     Comments: Localized tenderness outer part of left breast near axilla at 3 o'clock. No mass noted.  Lymphadenopathy:     Upper Body:     Right upper body: No supraclavicular, axillary or pectoral adenopathy.     Left upper body: No  supraclavicular, axillary or pectoral adenopathy.  Skin:    General: Skin is warm and dry.  Neurological:     Mental Status: She is alert and oriented to person, place, and time.  Psychiatric:        Mood and Affect: Mood normal.        Behavior: Behavior normal.        Thought Content: Thought content normal.        Judgment: Judgment normal.  Gets on and off exam table without assistance. Gait steady.   .. Vitals:   09/15/21 1124  BP: (!) 143/90  Pulse: 91  Temp: 97.7 F (36.5 C)  Height: 5\' 8"  (1.727 m)  Weight: 176 lb (79.8 kg)  SpO2: 96%  BMI (Calculated): 26.77         Assessment & Plan:  Breast pain, left - Plan: MM DIAG BREAST TOMO BILATERAL, BREAST LTD UNI LEFT INC AXILLA, US BREAST LTD UNI RIGHT INC AXILLA  Flu vaccine need - Plan: Flu Vaccine QUAD High Dose(Fluad) Meds ordered this encounter  Medications   pravastatin (PRAVACHOL) 20 MG tablet    Sig: Take 1 tablet (20 mg total) by mouth daily.    Dispense:  90 tablet    Refill:  1    Order Specific Question:   Supervising Provider    Answer:   [place] A [[id number]]    Flu vaccine today.  Referral for diagnostic mammogram and  ultrasound of left breast. Further follow up based on results. Call back sooner if any changes.  Continue prescribed medications as directed.  Take Aleve as directed PO PRN for left breast pain.  Return for Routine Follow-up as planned.

## 2021-09-15 NOTE — Patient Instructions (Addendum)
Alieve as needed for pain twice a day for the next few days.

## 2021-09-15 NOTE — Progress Notes (Signed)
   Subjective:    Patient ID: Kristi Grant, female    DOB: [birth year], 87 y.o.   MRN: [id number]  HPI low BP readings at home , has been taking 1/4 tab of her amlodipine Left breast pain randomly x 1 week     Review of Systems     Objective:   Physical Exam        Assessment & Plan:

## 2021-09-16 ENCOUNTER — Encounter: Payer: Self-pay | Admitting: Nurse Practitioner

## 2021-10-02 DIAGNOSIS — H903 Sensorineural hearing loss, bilateral: Secondary | ICD-10-CM | POA: Diagnosis not present

## 2021-10-03 ENCOUNTER — Encounter (HOSPITAL_COMMUNITY): Payer: Self-pay

## 2021-10-03 ENCOUNTER — Ambulatory Visit (HOSPITAL_COMMUNITY): Admission: RE | Admit: 2021-10-03 | Payer: Medicare PPO | Source: Ambulatory Visit

## 2021-10-03 ENCOUNTER — Ambulatory Visit (HOSPITAL_COMMUNITY)
Admission: RE | Admit: 2021-10-03 | Discharge: 2021-10-03 | Disposition: A | Discharge status: Home / Self Care | Payer: Medicare PPO | Source: Ambulatory Visit | Attending: Nurse Practitioner | Admitting: Nurse Practitioner

## 2021-10-03 ENCOUNTER — Other Ambulatory Visit: Payer: Self-pay

## 2021-10-03 DIAGNOSIS — N644 Mastodynia: Secondary | ICD-10-CM | POA: Diagnosis not present

## 2021-10-03 DIAGNOSIS — R922 Inconclusive mammogram: Secondary | ICD-10-CM | POA: Diagnosis not present

## 2021-11-01 ENCOUNTER — Telehealth: Payer: Self-pay | Admitting: Family Medicine

## 2021-11-01 ENCOUNTER — Ambulatory Visit
Admission: EM | Admit: 2021-11-01 | Discharge: 2021-11-01 | Disposition: A | Discharge status: Home / Self Care | Payer: Medicare PPO | Attending: Family Medicine | Admitting: Family Medicine

## 2021-11-01 ENCOUNTER — Encounter: Payer: Self-pay | Admitting: Emergency Medicine

## 2021-11-01 ENCOUNTER — Other Ambulatory Visit: Payer: Self-pay

## 2021-11-01 DIAGNOSIS — N390 Urinary tract infection, site not specified: Secondary | ICD-10-CM

## 2021-11-01 LAB — POCT URINALYSIS DIP (MANUAL ENTRY)
Glucose, UA: 250 mg/dL — AB
Nitrite, UA: POSITIVE — AB
Protein Ur, POC: 100 mg/dL — AB
Spec Grav, UA: 1.015 (ref 1.010–1.025)
Urobilinogen, UA: 4 E.U./dL — AB
pH, UA: 5 (ref 5.0–8.0)

## 2021-11-01 MED ORDER — SULFAMETHOXAZOLE-TRIMETHOPRIM 800-160 MG PO TABS
1.0000 | ORAL_TABLET | Freq: Two times a day (BID) | ORAL | 0 refills | Status: DC
Start: 2021-11-01 — End: 2022-06-05

## 2021-11-01 NOTE — ED Triage Notes (Signed)
Symptoms started on Monday.  Has taken AZO.  Bladder spasms. Pain on urination and odor to urine.

## 2021-11-01 NOTE — Telephone Encounter (Signed)
Nurses-please find out from [facility] if she having more just burning with urination?  Any fevers?  Any severe abdominal pain?  Does she feel she can come up here and give a urine? (If so we could do her as a work in later this afternoon that would essentially be attention to the urinary issue)

## 2021-11-01 NOTE — Telephone Encounter (Signed)
Patient states she has been having burning and spasms- no fever or abdominal pain. Patient states she has already checked into urgent care and is going to wait to be seen there

## 2021-11-01 NOTE — ED Provider Notes (Signed)
[facility]    CSN: [id number] Arrival date & time: 11/01/21  1416      History   Chief Complaint No chief complaint on file.   HPI Kristi Grant is a 87 y.o. female.   Patient presenting today with 2-day history of dysuria, urinary frequency and urgency.  Denies abdominal pain, nausea vomiting diarrhea, fever, chills, back pain, hematuria, bowel changes.  So far taking Azo as needed with minimal relief.  History of recurrent urinary tract infections.   Past Medical History:  Diagnosis Date   Arthritis    Blood transfusion without reported diagnosis    Cataract    Hypertension    Neuromuscular disorder [facility])     Patient Active Problem List   Diagnosis Date Noted   Gout 01/24/2016   Osteopenia 01/24/2016   History of left knee replacement 06/28/2015   Essential hypertension, benign 09/01/2013   Hyperlipemia 09/01/2013   UTI (urinary tract infection) 06/28/2013    Past Surgical History:  Procedure Laterality Date   ABDOMINAL HYSTERECTOMY     BASAL CELL CARCINOMA EXCISION  01/2011   nose   CATARACT EXTRACTION     Ovarian wedge resection  1962   REPLACEMENT TOTAL KNEE Left 05/2010   VAGINAL HYSTERECTOMY  1990    OB History   No obstetric history on file.      Home Medications    Prior to Admission medications   Medication Sig Start Date End Date Taking? Authorizing Provider  sulfamethoxazole-trimethoprim (BACTRIM DS) 800-160 MG tablet Take 1 tablet by mouth 2 (two) times daily. 11/01/21  Yes Particia Nearing, PA-C  allopurinol (ZYLOPRIM) 100 MG tablet TAKE ONE TABLET BY MOUTH ONCE DAILY. 08/01/21   Babs Sciara, MD  amLODipine (NORVASC) 2.5 MG tablet Take 1 tablet po daily 07/15/20   Luking, Jonna Coup, MD  Coenzyme Q10 (COQ-10) 100 MG CAPS Take by mouth daily.    [provider]  Methylcobalamin (B12-ACTIVE PO) Take by mouth.    [provider]  OVER THE COUNTER MEDICATION Vit d daily    [provider]  pravastatin  (PRAVACHOL) 20 MG tablet Take 1 tablet (20 mg total) by mouth daily. 09/15/21   Campbell Riches, NP    Family History Family History  Problem Relation Age of Onset   Heart disease Mother    Heart disease Father    Breast cancer Sister    Cirrhosis Sister    Diabetes Sister    Breast cancer Maternal Grandmother    Ovarian cancer Maternal Grandmother     Social History Social History   Tobacco Use   Smoking status: Never   Smokeless tobacco: Never  Substance Use Topics   Alcohol use: No   Drug use: No     Allergies   Clindamycin/lincomycin, Ciprofloxacin, Flagyl [metronidazole], Hydrocodone, Iohexol, Nucynta [tapentadol], and Rocephin [ceftriaxone sodium in dextrose]   Review of Systems Review of Systems Per HPI  Physical Exam Triage Vital Signs ED Triage Vitals  Enc Vitals Group     BP 11/01/21 1547 (!) 170/78     Pulse Rate 11/01/21 1547 92     Resp 11/01/21 1547 18     Temp 11/01/21 1547 (!) 97.4 F (36.3 C)     Temp Source 11/01/21 1547 Oral     SpO2 11/01/21 1547 95 %     Weight --      Height --      Head Circumference --      Peak  Flow --      Pain Score 11/01/21 1548 2     Pain Loc --      Pain Edu? --      Excl. in [place]? --    No data found.  Updated Vital Signs BP (!) 170/78 (BP Location: Right Arm)   Pulse 92   Temp (!) 97.4 F (36.3 C) (Oral)   Resp 18   SpO2 95%   Visual Acuity Right Eye Distance:   Left Eye Distance:   Bilateral Distance:    Right Eye Near:   Left Eye Near:    Bilateral Near:     Physical Exam Vitals and nursing note reviewed.  Constitutional:      Appearance: Normal appearance. She is not ill-appearing.  HENT:     Head: Atraumatic.  Eyes:     Extraocular Movements: Extraocular movements intact.     Conjunctiva/sclera: Conjunctivae normal.  Cardiovascular:     Rate and Rhythm: Normal rate and regular rhythm.     Heart sounds: Normal heart sounds.  Pulmonary:     Effort: Pulmonary effort is normal.      Breath sounds: Normal breath sounds.  Abdominal:     General: Bowel sounds are normal. There is no distension.     Palpations: Abdomen is soft.     Tenderness: There is no abdominal tenderness. There is no right CVA tenderness, left CVA tenderness or guarding.  Musculoskeletal:        General: Normal range of motion.     Cervical back: Normal range of motion and neck supple.  Skin:    General: Skin is warm and dry.  Neurological:     Mental Status: She is alert and oriented to person, place, and time.  Psychiatric:        Mood and Affect: Mood normal.        Thought Content: Thought content normal.        Judgment: Judgment normal.     UC Treatments / Results  Labs (all labs ordered are listed, but only abnormal results are displayed) Labs Reviewed  POCT URINALYSIS DIP (MANUAL ENTRY) - Abnormal; Notable for the following components:      Result Value   Color, UA orange (*)    Glucose, UA =250 (*)    Bilirubin, UA moderate (*)    Ketones, POC UA small (15) (*)    Blood, UA large (*)    Protein Ur, POC =100 (*)    Urobilinogen, UA 4.0 (*)    Nitrite, UA Positive (*)    Leukocytes, UA Large (3+) (*)    All other components within normal limits  URINE CULTURE    EKG   Radiology No results found.  Procedures Procedures (including critical care time)  Medications Ordered in UC Medications - No data to display  Initial Impression / Assessment and Plan / UC Course  I have reviewed the triage vital signs and the nursing notes.  Pertinent labs & imaging results that were available during my care of the patient were reviewed by me and considered in my medical decision making (see chart for details).     Vital exam reassuring, urinalysis showing numerous abnormalities likely secondary to Azo use.  Given her history of urinary tract infections and consistent symptoms, will start Bactrim awaiting urine culture results.  Continue Azo as needed, push fluids and return for  acutely worsening symptoms.  We will adjust as needed based on culture results.  Final Clinical Impressions(s) /  UC Diagnoses   Final diagnoses:  Acute lower UTI   Discharge Instructions   None    ED Prescriptions     Medication Sig Dispense Auth. Provider   sulfamethoxazole-trimethoprim (BACTRIM DS) 800-160 MG tablet Take 1 tablet by mouth 2 (two) times daily. 6 tablet Volney American, [place]      PDMP not reviewed this encounter.   Volney American, [place] 11/01/21 1614

## 2021-11-01 NOTE — Telephone Encounter (Signed)
Patient has UTI. Taking AVO tablets the are helping with spasms. No openings the rest of the week. Please advise  Pt#:  [phone]

## 2021-11-03 ENCOUNTER — Telehealth (HOSPITAL_COMMUNITY): Payer: Self-pay | Admitting: Emergency Medicine

## 2021-11-03 LAB — URINE CULTURE: Culture: 70000 — AB

## 2021-11-03 MED ORDER — FOSFOMYCIN TROMETHAMINE 3 G PO PACK: 3.0000 g | PACK | Freq: Once | ORAL | 0 refills | Status: AC

## 2022-02-28 ENCOUNTER — Other Ambulatory Visit: Payer: Self-pay | Admitting: Family Medicine

## 2022-03-27 DIAGNOSIS — Z961 Presence of intraocular lens: Secondary | ICD-10-CM | POA: Diagnosis not present

## 2022-03-27 DIAGNOSIS — H35413 Lattice degeneration of retina, bilateral: Secondary | ICD-10-CM | POA: Diagnosis not present

## 2022-03-27 DIAGNOSIS — H52203 Unspecified astigmatism, bilateral: Secondary | ICD-10-CM | POA: Diagnosis not present

## 2022-04-04 ENCOUNTER — Other Ambulatory Visit: Payer: Self-pay | Admitting: Internal Medicine

## 2022-05-18 DIAGNOSIS — Z08 Encounter for follow-up examination after completed treatment for malignant neoplasm: Secondary | ICD-10-CM | POA: Diagnosis not present

## 2022-05-18 DIAGNOSIS — D1801 Hemangioma of skin and subcutaneous tissue: Secondary | ICD-10-CM | POA: Diagnosis not present

## 2022-05-18 DIAGNOSIS — L218 Other seborrheic dermatitis: Secondary | ICD-10-CM | POA: Diagnosis not present

## 2022-05-18 DIAGNOSIS — Z85828 Personal history of other malignant neoplasm of skin: Secondary | ICD-10-CM | POA: Diagnosis not present

## 2022-05-18 DIAGNOSIS — L821 Other seborrheic keratosis: Secondary | ICD-10-CM | POA: Diagnosis not present

## 2022-05-18 DIAGNOSIS — L814 Other melanin hyperpigmentation: Secondary | ICD-10-CM | POA: Diagnosis not present

## 2022-06-05 ENCOUNTER — Ambulatory Visit: Payer: Medicare PPO | Admitting: Nurse Practitioner

## 2022-06-05 ENCOUNTER — Encounter: Payer: Self-pay | Admitting: Nurse Practitioner

## 2022-06-05 VITALS — BP 180/82 | HR 78 | Temp 97.7°F | Ht 68.0 in | Wt 175.0 lb

## 2022-06-05 DIAGNOSIS — I10 Essential (primary) hypertension: Secondary | ICD-10-CM | POA: Diagnosis not present

## 2022-06-05 MED ORDER — ALLOPURINOL 100 MG PO TABS: 100.0000 mg | ORAL_TABLET | Freq: Every day | ORAL | 1 refills | Status: DC

## 2022-06-05 MED ORDER — PRAVASTATIN SODIUM 20 MG PO TABS: 20.0000 mg | ORAL_TABLET | Freq: Every day | ORAL | 1 refills | Status: DC

## 2022-06-05 NOTE — Progress Notes (Signed)
   Subjective:    Patient ID: Kristi Grant, female    DOB: [birth year], 88 y.o.   MRN: [id number]  HPI Patient reports Hypotension , has home readings , dizyness while standing and bending over, also when busy. Taking amlodipine 2.5 mg - 1/4 tab q 4 days.  Patient brought in her home readings.  Blood pressure is stable around 120s over 80s for about 4 to 6 days and then her blood pressure will begin to creep up to 140s over 70 to 150s over 80s.  Patient states that after she takes her quarter tablet of a 2.5 mg of amlodipine her blood pressures will then drop to 90s over 60s in which she begins to feel dizzy with standing or bending over.  Blood pressure today is 180/82.  Patient states that she has taken other blood pressure medications in the past but unsure which ones.  Patient denies any headaches, swelling to lower extremities, changes to vision, chest pain, difficulty breathing.   Review of Systems  All other systems reviewed and are negative.      Objective:   Physical Exam Vitals reviewed.  Constitutional:      General: She is not in acute distress.    Appearance: Normal appearance. She is normal weight. She is not ill-appearing, toxic-appearing or diaphoretic.  Cardiovascular:     Rate and Rhythm: Normal rate and regular rhythm.     Pulses: Normal pulses.     Heart sounds: Normal heart sounds. No murmur heard. Pulmonary:     Effort: Pulmonary effort is normal. No respiratory distress.     Breath sounds: Normal breath sounds. No wheezing.  Musculoskeletal:     Comments: Grossly intact  Skin:    General: Skin is warm.     Capillary Refill: Capillary refill takes less than 2 seconds.  Neurological:     Mental Status: She is alert.     Comments: Grossly intact  Psychiatric:        Mood and Affect: Mood normal.        Behavior: Behavior normal.        Assessment & Plan:   1. Essential hypertension, benign -Blood pressure today 180/82.  In 180/88 upon  recheck. -Patient states that blood pressures run a lot lower at home, therefore do not believe changes to her medication regiment is needed at this time due to the wide fluctuations of her blood pressure. -Patient to continue taking 1/4 2.5 mg amlodipine every 4 to 6 days. -Continue to monitor blood pressure carefully and do not take medication as blood pressure is less than 100 over 60s. -If blood pressure consistently elevated despite taking medication return to clinic -If you develop changes to vision, intense headache, chest pain, shortness of breath go to the emergency room -Return to clinic next week to see Dr. Lorin Picket    Note:  This document was prepared using Dragon voice recognition software and may include unintentional dictation errors. Note - This record has been created using [employer].  Chart creation errors have been sought, but may not always  have been located. Such creation errors do not reflect on  the standard of medical care.

## 2022-07-03 ENCOUNTER — Ambulatory Visit (INDEPENDENT_AMBULATORY_CARE_PROVIDER_SITE_OTHER): Payer: Medicare PPO

## 2022-07-03 VITALS — Ht 68.0 in | Wt 175.0 lb

## 2022-07-03 DIAGNOSIS — Z0000 Encounter for general adult medical examination without abnormal findings: Secondary | ICD-10-CM | POA: Diagnosis not present

## 2022-07-03 NOTE — Progress Notes (Signed)
Subjective:   Kristi Grant is a 88 y.o. female who presents for Medicare Annual (Subsequent) preventive examination. Virtual Visit via Telephone Note  I connected with  Kristi Grant on 07/03/22 at  8:15 AM EDT by telephone and verified that I am speaking with the correct person using two identifiers.  Location: Patient: HOME Provider: [facility] Persons participating in the virtual visit: patient/Nurse Health Advisor   I discussed the limitations, risks, security and privacy concerns of performing an evaluation and management service by telephone and the availability of in person appointments. The patient expressed understanding and agreed to proceed.  Interactive audio and video telecommunications were attempted between this nurse and patient, however failed, due to patient having technical difficulties OR patient did not have access to video capability.  We continued and completed visit with audio only.  Some vital signs may be absent or patient reported.   Kristi Dash, LPN  Review of Systems     Cardiac Risk Factors include: advanced age (>53men, >35 women);hypertension;dyslipidemia;sedentary lifestyle     Objective:    Today's Vitals   07/03/22 0815  Weight: 175 lb (79.4 kg)  Height: 5\' 8"  (1.727 m)   Body mass index is 26.61 kg/m.     07/03/2022    8:20 AM 06/13/2021   11:11 AM 01/28/2015    8:17 AM  Advanced Directives  Does Patient Have a Medical Advance Directive? Yes Yes Yes  Type of 03/29/2015 of [place];Living will Healthcare Power of [place];Living will   Does patient want to make changes to medical advance directive?  No - Patient declined   Copy of Healthcare Power of Attorney in Chart? No - copy requested No - copy requested No - copy requested  Would patient like information on creating a medical advance directive?  No - Patient declined     Current Medications (verified) Outpatient Encounter Medications as of 07/03/2022   Medication Sig   allopurinol (ZYLOPRIM) 100 MG tablet Take 1 tablet (100 mg total) by mouth daily.   amLODipine (NORVASC) 2.5 MG tablet TAKE ONE TABLET BY MOUTH ONCE DAILY.   Coenzyme Q10 (COQ-10) 100 MG CAPS Take by mouth daily.   Methylcobalamin (B12-ACTIVE PO) Take by mouth.   OVER THE COUNTER MEDICATION Vit d daily   pravastatin (PRAVACHOL) 20 MG tablet Take 1 tablet (20 mg total) by mouth daily.   No facility-administered encounter medications on file as of 07/03/2022.    Allergies (verified) Clindamycin/lincomycin, Ciprofloxacin, Flagyl [metronidazole], Hydrocodone, Iohexol, Nucynta [tapentadol], and Rocephin [ceftriaxone sodium in dextrose]   History: Past Medical History:  Diagnosis Date   Arthritis    Blood transfusion without reported diagnosis    Cataract    Hypertension    Neuromuscular disorder [facility])    Past Surgical History:  Procedure Laterality Date   ABDOMINAL HYSTERECTOMY     BASAL CELL CARCINOMA EXCISION  01/2011   nose   CATARACT EXTRACTION     Ovarian wedge resection  1962   REPLACEMENT TOTAL KNEE Left 05/2010   VAGINAL HYSTERECTOMY  1990   Family History  Problem Relation Age of Onset   Heart disease Mother    Heart disease Father    Breast cancer Sister    Cirrhosis Sister    Diabetes Sister    Breast cancer Maternal Grandmother    Ovarian cancer Maternal Grandmother    Social History   Socioeconomic History   Marital status: Married    Spouse name: Not on file  Number of children: Not on file   Years of education: Not on file   Highest education level: Not on file  Occupational History   Not on file  Tobacco Use   Smoking status: Never   Smokeless tobacco: Never  Substance and Sexual Activity   Alcohol use: No   Drug use: No   Sexual activity: Not on file  Other Topics Concern   Not on file  Social History Narrative   Not on file   Social Determinants of Health   Financial Resource Strain: Low Risk  (06/13/2021)   Overall  Financial Resource Strain (CARDIA)    Difficulty of Paying Living Expenses: Not hard at all  Food Insecurity: No Food Insecurity (07/03/2022)   Hunger Vital Sign    Worried About Running Out of Food in the Last Year: Never true    Ran Out of Food in the Last Year: Never true  Transportation Needs: No Transportation Needs (07/03/2022)   PRAPARE - Administrator, Civil Service (Medical): No    Lack of Transportation (Non-Medical): No  Physical Activity: Sufficiently Active (07/03/2022)   Exercise Vital Sign    Days of Exercise per Week: 5 days    Minutes of Exercise per Session: 30 min  Stress: No Stress Concern Present (07/03/2022)   [employer] of Occupational Health - Occupational Stress Questionnaire    Feeling of Stress : Not at all  Social Connections: Socially Integrated (07/03/2022)   Social Connection and Isolation Panel [NHANES]    Frequency of Communication with Friends and Family: More than three times a week    Frequency of Social Gatherings with Friends and Family: More than three times a week    Attends Religious Services: More than 4 times per year    Active Member of [employer] or Organizations: Yes    Attends Engineer, structural: More than 4 times per year    Marital Status: Married    Tobacco Counseling Counseling given: Not Answered   Clinical Intake:  Pre-visit preparation completed: Yes  Pain : No/denies pain     BMI - recorded: 26.61 Nutritional Status: BMI 25 -29 Overweight Nutritional Risks: None Diabetes: No  How often do you need to have someone help you when you read instructions, pamphlets, or other written materials from your doctor or pharmacy?: 1 - Never  Diabetic?NO  Interpreter Needed?: No  Information entered by :: mj Bernette Seeman, lpn   Activities of Daily Living    07/03/2022    8:23 AM  In your present state of health, do you have any difficulty performing the following activities:  Hearing? 1  Vision? 0   Difficulty concentrating or making decisions? 0  Walking or climbing stairs? 0  Dressing or bathing? 0  Doing errands, shopping? 0  Preparing Food and eating ? N  Using the Toilet? N  In the past six months, have you accidently leaked urine? Y  Do you have problems with loss of bowel control? N  Managing your Medications? N  Managing your Finances? N  Housekeeping or managing your Housekeeping? N    Patient Care Team: Babs Sciara, MD as PCP - General (Family Medicine)  Indicate any recent Medical Services you may have received from other than [facility] providers in the past year (date may be approximate).     Assessment:   This is a routine wellness examination for [place].  Hearing/Vision screen Hearing Screening - Comments:: Wears hearing aids.  Vision Screening - Comments::  Readers/Glasses for distance. Dr. Celene Squibb. 2023.  Dietary issues and exercise activities discussed: Current Exercise Habits: Home exercise routine, Type of exercise: walking, Time (Minutes): 30, Frequency (Times/Week): 5, Weekly Exercise (Minutes/Week): 150, Intensity: Mild, Exercise limited by: cardiac condition(s)   Goals Addressed             This Visit's Progress    Patient Stated   On track    I would like to stay active as long as I can      Prevent falls   On track      Depression Screen    07/03/2022    8:18 AM 09/15/2021   11:27 AM 06/13/2021   11:14 AM 03/13/2021   11:03 AM 03/13/2021   11:02 AM 09/05/2020   10:18 AM 07/19/2017   11:03 AM  PHQ 2/9 Scores  PHQ - 2 Score 0 0 0 0 0 0 0    Fall Risk    07/03/2022    8:20 AM 09/15/2021   11:27 AM 06/13/2021   11:13 AM 03/13/2021   11:01 AM 09/05/2020   12:26 PM  Fall Risk   Falls in the past year? 0 0 1 1 0  Number falls in past yr: 0 0 0 0   Injury with Fall? 0 0 0 1   Risk for fall due to : No Fall Risks No Fall Risks Orthopedic patient History of fall(s)   Follow up Falls prevention discussed Falls evaluation completed Falls  evaluation completed;Falls prevention discussed Education provided;Falls evaluation completed Falls evaluation completed    FALL RISK PREVENTION PERTAINING TO THE HOME:  Any stairs in or around the home? Yes  If so, are there any without handrails? No  Home free of loose throw rugs in walkways, pet beds, electrical cords, etc? Yes  Adequate lighting in your home to reduce risk of falls? Yes   ASSISTIVE DEVICES UTILIZED TO PREVENT FALLS:  Life alert? No  Use of a cane, walker or w/c? No  Grab bars in the bathroom? Yes  Shower chair or bench in shower? Yes  Elevated toilet seat or a handicapped toilet? Yes   TIMED UP AND GO:  Was the test performed? No .  Phone visit. Cognitive Function:        07/03/2022    8:23 AM  6CIT Screen  What Year? 0 points  What month? 0 points  What time? 0 points  Count back from 20 0 points  Months in reverse 0 points  Repeat phrase 0 points  Total Score 0 points    Immunizations Immunization History  Administered Date(s) Administered   Fluad Quad(high Dose 65+) 10/20/2020, 09/15/2021   Influenza, High Dose Seasonal PF 10/03/2018, 09/23/2019   Influenza-Unspecified 10/01/2013, 10/03/2015, 10/23/2017, 10/02/2018, 09/23/2019   Moderna SARS-COV2 Booster Vaccination 03/21/2021   Moderna Sars-Covid-2 Vaccination 01/28/2020, 02/26/2020, 08/31/2020   Pneumococcal Conjugate-13 07/01/2014   Pneumococcal-Unspecified 09/23/2010   Tdap 07/28/2020   Zoster Recombinat (Shingrix) 10/16/2017, 03/18/2018   Zoster, Live 12/25/2007    TDAP status: Up to date  Flu Vaccine status: Up to date  Pneumococcal vaccine status: Up to date  Covid-19 vaccine status: Completed vaccines  Qualifies for Shingles Vaccine? Yes   Zostavax completed Yes   Shingrix Completed?: Yes  Screening Tests Health Maintenance  Topic Date Due   COVID-19 Vaccine (4 - Booster for Moderna series) 07/19/2022 (Originally 05/16/2021)   Pneumonia Vaccine 85+ Years old (2 -  PPSV23 or PCV20) 12/21/2022 (Originally 07/02/2015)   INFLUENZA VACCINE  07/24/2022  TETANUS/TDAP  07/28/2030   DEXA SCAN  Completed   Zoster Vaccines- Shingrix  Completed   HPV VACCINES  Aged Out    Health Maintenance  There are no preventive care reminders to display for this patient.   Colorectal cancer screening: No longer required.   Mammogram status: Completed 10/03/2021. Repeat every year  Bone Density status: Completed 01/30/2016. Results reflect: Bone density results: OSTEOPENIA. Repeat every 2 years.  Lung Cancer Screening: (Low Dose CT Chest recommended if Age 8-80 years, 30 pack-year currently smoking OR have quit w/in 15years.) does not qualify.   Additional Screening:  Hepatitis C Screening: does not qualify;  Vision Screening: Recommended annual ophthalmology exams for early detection of glaucoma and other disorders of the eye. Is the patient up to date with their annual eye exam?  Yes  Who is the provider or what is the name of the office in which the patient attends annual eye exams? Dr. Honor Loh If pt is not established with a provider, would they like to be referred to a provider to establish care? No .   Dental Screening: Recommended annual dental exams for proper oral hygiene  Community Resource Referral / Chronic Care Management: CRR required this visit?  No   CCM required this visit?  No      Plan:     I have personally reviewed and noted the following in the patient's chart:   Medical and social history Use of alcohol, tobacco or illicit drugs  Current medications and supplements including opioid prescriptions.  Functional ability and status Nutritional status Physical activity Advanced directives List of other physicians Hospitalizations, surgeries, and ER visits in previous 12 months Vitals Screenings to include cognitive, depression, and falls Referrals and appointments  In addition, I have reviewed and discussed with patient certain  preventive protocols, quality metrics, and best practice recommendations. A written personalized care plan for preventive services as well as general preventive health recommendations were provided to patient.     Kaisyn Reinhold, LPN   12/29/2692   Nurse Notes: Pt is up to date on vaccines and health maintenance.

## 2022-07-03 NOTE — Patient Instructions (Signed)
[name] , Thank you for taking time to come for your Medicare Wellness Visit. I appreciate your ongoing commitment to your health goals. Please review the following plan we discussed and let me know if I can assist you in the future.   Screening recommendations/referrals: Colonoscopy: No longer required. Mammogram: Done 10/03/2021 Repeat annually  Bone Density: Done 01/30/2016. Repeat every 2 years  Recommended yearly ophthalmology/optometry visit for glaucoma screening and checkup Recommended yearly dental visit for hygiene and checkup  Vaccinations: Influenza vaccine: Done 09/15/2021 Repeat annually  Pneumococcal vaccine: Done 07/01/2014 and 09/23/2010. Tdap vaccine: Done 07/28/2020. Repeat in 10 years  Shingles vaccine: Done 03/18/2018, 10/16/2017 and 12/25/2007.   Covid-19:Done 03/21/2021, 08/31/2020, 02/26/2020 and 01/28/2020.  Advanced directives: Please bring a copy of your health care power of attorney and living will to the office to be added to your chart at your convenience.   Conditions/risks identified: KEEP UP THE GOOD WORK!!  Next appointment: Follow up in one year for your annual wellness visit 2024.   Preventive Care 32 Years and Older, Female Preventive care refers to lifestyle choices and visits with your health care provider that can promote health and wellness. What does preventive care include? A yearly physical exam. This is also called an annual well check. Dental exams once or twice a year. Routine eye exams. Ask your health care provider how often you should have your eyes checked. Personal lifestyle choices, including: Daily care of your teeth and gums. Regular physical activity. Eating a healthy diet. Avoiding tobacco and drug use. Limiting alcohol use. Practicing safe sex. Taking low-dose aspirin every day. Taking vitamin and mineral supplements as recommended by your health care provider. What happens during an annual well check? The services and screenings  done by your health care provider during your annual well check will depend on your age, overall health, lifestyle risk factors, and family history of disease. Counseling  Your health care provider may ask you questions about your: Alcohol use. Tobacco use. Drug use. Emotional well-being. Home and relationship well-being. Sexual activity. Eating habits. History of falls. Memory and ability to understand (cognition). Work and work Astronomer. Reproductive health. Screening  You may have the following tests or measurements: Height, weight, and BMI. Blood pressure. Lipid and cholesterol levels. These may be checked every 5 years, or more frequently if you are over 6 years old. Skin check. Lung cancer screening. You may have this screening every year starting at age 47 if you have a 30-pack-year history of smoking and currently smoke or have quit within the past 15 years. Fecal occult blood test (FOBT) of the stool. You may have this test every year starting at age 38. Flexible sigmoidoscopy or colonoscopy. You may have a sigmoidoscopy every 5 years or a colonoscopy every 10 years starting at age 47. Hepatitis C blood test. Hepatitis B blood test. Sexually transmitted disease (STD) testing. Diabetes screening. This is done by checking your blood sugar (glucose) after you have not eaten for a while (fasting). You may have this done every 1-3 years. Bone density scan. This is done to screen for osteoporosis. You may have this done starting at age 50. Mammogram. This may be done every 1-2 years. Talk to your health care provider about how often you should have regular mammograms. Talk with your health care provider about your test results, treatment options, and if necessary, the need for more tests. Vaccines  Your health care provider may recommend certain vaccines, such as: Influenza vaccine. This is  recommended every year. Tetanus, diphtheria, and acellular pertussis (Tdap, Td)  vaccine. You may need a Td booster every 10 years. Zoster vaccine. You may need this after age 53. Pneumococcal 13-valent conjugate (PCV13) vaccine. One dose is recommended after age 70. Pneumococcal polysaccharide (PPSV23) vaccine. One dose is recommended after age 77. Talk to your health care provider about which screenings and vaccines you need and how often you need them. This information is not intended to replace advice given to you by your health care provider. Make sure you discuss any questions you have with your health care provider. Document Released: 01/06/2016 Document Revised: 08/29/2016 Document Reviewed: 10/11/2015 Elsevier Interactive Patient Education  2017 [place] Prevention in the Home Falls can cause injuries. They can happen to people of all ages. There are many things you can do to make your home safe and to help prevent falls. What can I do on the outside of my home? Regularly fix the edges of walkways and driveways and fix any cracks. Remove anything that might make you trip as you walk through a door, such as a raised step or threshold. Trim any bushes or trees on the path to your home. Use bright outdoor lighting. Clear any walking paths of anything that might make someone trip, such as rocks or tools. Regularly check to see if handrails are loose or broken. Make sure that both sides of any steps have handrails. Any raised decks and porches should have guardrails on the edges. Have any leaves, snow, or ice cleared regularly. Use sand or salt on walking paths during winter. Clean up any spills in your garage right away. This includes oil or grease spills. What can I do in the bathroom? Use night lights. Install grab bars by the toilet and in the tub and shower. Do not use towel bars as grab bars. Use non-skid mats or decals in the tub or shower. If you need to sit down in the shower, use a plastic, non-slip stool. Keep the floor dry. Clean up any  water that spills on the floor as soon as it happens. Remove soap buildup in the tub or shower regularly. Attach bath mats securely with double-sided non-slip rug tape. Do not have throw rugs and other things on the floor that can make you trip. What can I do in the bedroom? Use night lights. Make sure that you have a light by your bed that is easy to reach. Do not use any sheets or blankets that are too big for your bed. They should not hang down onto the floor. Have a firm chair that has side arms. You can use this for support while you get dressed. Do not have throw rugs and other things on the floor that can make you trip. What can I do in the kitchen? Clean up any spills right away. Avoid walking on wet floors. Keep items that you use a lot in easy-to-reach places. If you need to reach something above you, use a strong step stool that has a grab bar. Keep electrical cords out of the way. Do not use floor polish or wax that makes floors slippery. If you must use wax, use non-skid floor wax. Do not have throw rugs and other things on the floor that can make you trip. What can I do with my stairs? Do not leave any items on the stairs. Make sure that there are handrails on both sides of the stairs and use them. Fix handrails that are  broken or loose. Make sure that handrails are as long as the stairways. Check any carpeting to make sure that it is firmly attached to the stairs. Fix any carpet that is loose or worn. Avoid having throw rugs at the top or bottom of the stairs. If you do have throw rugs, attach them to the floor with carpet tape. Make sure that you have a light switch at the top of the stairs and the bottom of the stairs. If you do not have them, ask someone to add them for you. What else can I do to help prevent falls? Wear shoes that: Do not have high heels. Have rubber bottoms. Are comfortable and fit you well. Are closed at the toe. Do not wear sandals. If you use a  stepladder: Make sure that it is fully opened. Do not climb a closed stepladder. Make sure that both sides of the stepladder are locked into place. Ask someone to hold it for you, if possible. Clearly mark and make sure that you can see: Any grab bars or handrails. First and last steps. Where the edge of each step is. Use tools that help you move around (mobility aids) if they are needed. These include: Canes. Walkers. Scooters. Crutches. Turn on the lights when you go into a dark area. Replace any light bulbs as soon as they burn out. Set up your furniture so you have a clear path. Avoid moving your furniture around. If any of your floors are uneven, fix them. If there are any pets around you, be aware of where they are. Review your medicines with your doctor. Some medicines can make you feel dizzy. This can increase your chance of falling. Ask your doctor what other things that you can do to help prevent falls. This information is not intended to replace advice given to you by your health care provider. Make sure you discuss any questions you have with your health care provider. Document Released: 10/06/2009 Document Revised: 05/17/2016 Document Reviewed: 01/14/2015 Elsevier Interactive Patient Education  2017 [employer].

## 2022-07-09 ENCOUNTER — Ambulatory Visit: Payer: Medicare PPO | Admitting: Family Medicine

## 2022-07-09 VITALS — BP 138/78 | HR 79 | Temp 97.3°F | Ht 68.0 in | Wt 174.0 lb

## 2022-07-09 DIAGNOSIS — R0609 Other forms of dyspnea: Secondary | ICD-10-CM | POA: Diagnosis not present

## 2022-07-09 DIAGNOSIS — E7849 Other hyperlipidemia: Secondary | ICD-10-CM

## 2022-07-09 DIAGNOSIS — I950 Idiopathic hypotension: Secondary | ICD-10-CM | POA: Diagnosis not present

## 2022-07-09 DIAGNOSIS — R5383 Other fatigue: Secondary | ICD-10-CM | POA: Diagnosis not present

## 2022-07-09 DIAGNOSIS — G909 Disorder of the autonomic nervous system, unspecified: Secondary | ICD-10-CM | POA: Diagnosis not present

## 2022-07-09 DIAGNOSIS — M8580 Other specified disorders of bone density and structure, unspecified site: Secondary | ICD-10-CM | POA: Diagnosis not present

## 2022-07-09 NOTE — Progress Notes (Signed)
   Subjective:    Patient ID: Kristi Grant, female    DOB: [birth year], 88 y.o.   MRN: [id number]  Hypertension Treatments tried: amlodipine.  She comes in today having fluctuations of blood pressure.  Most of the time her readings look very good but sometimes they are quite low occasionally they are elevated she was taking a fourth of a tablet every 4 to 6 days but she is also watching her diet trying to stay active  She does relate getting short of breath with activity but denies chest pressure tightness denies PND denies edema in the legs  She does relate some fatigue and tiredness  Review of Systems     Objective:   Physical Exam General-in no acute distress Eyes-no discharge Lungs-respiratory rate normal, CTA CV-no murmurs,RRR Extremities skin warm dry no edema Neuro grossly normal Behavior normal, alert        Assessment & Plan:  1. Other fatigue Check CBC need to evaluate left ventricular function - Brain natriuretic peptide - Lipid panel - Hepatic Function Panel - Basic metabolic panel - CBC with Differential/Platelet  2. DOE (dyspnea on exertion) EKG does not show any acute changes but some failure to CT QRS progression check echo do not feel patient needs Myoview currently - EKG 12-Lead - Brain natriuretic peptide - Lipid panel - Hepatic Function Panel - Basic metabolic panel - CBC with Differential/Platelet  3. Idiopathic hypotension I believe that this is partly related into hot weather no need to take amlodipine she is only using a fourth of a tablet every 4 days - Brain natriuretic peptide - Lipid panel - Hepatic Function Panel - Basic metabolic panel - CBC with Differential/Platelet  4. Osteopenia, unspecified location Bone density to be done this fall - Brain natriuretic peptide - Lipid panel - Hepatic Function Panel - Basic metabolic panel - CBC with Differential/Platelet - DG Bone Density  5. Other hyperlipidemia Labs ordered - Brain  natriuretic peptide - Lipid panel - Hepatic Function Panel - Basic metabolic panel - CBC with Differential/Platelet  6. Autonomic dysfunction Labs ordered Keep hydrated send [place] blood pressure readings regular basis Mild drop in blood pressure with standing - Brain natriuretic peptide - Lipid panel - Hepatic Function Panel - Basic metabolic panel - CBC with Differential/Platelet

## 2022-07-10 ENCOUNTER — Other Ambulatory Visit: Payer: Self-pay | Admitting: Family Medicine

## 2022-07-10 DIAGNOSIS — R0609 Other forms of dyspnea: Secondary | ICD-10-CM

## 2022-07-10 NOTE — Progress Notes (Signed)
07/10/22- Echo placed.

## 2022-07-21 ENCOUNTER — Ambulatory Visit
Admission: EM | Admit: 2022-07-21 | Discharge: 2022-07-21 | Disposition: A | Discharge status: Home / Self Care | Payer: Medicare PPO | Attending: Family Medicine | Admitting: Family Medicine

## 2022-07-21 DIAGNOSIS — R109 Unspecified abdominal pain: Secondary | ICD-10-CM

## 2022-07-21 DIAGNOSIS — N390 Urinary tract infection, site not specified: Secondary | ICD-10-CM | POA: Insufficient documentation

## 2022-07-21 LAB — POCT URINALYSIS DIP (MANUAL ENTRY)
Glucose, UA: 250 mg/dL — AB
Nitrite, UA: POSITIVE — AB
Protein Ur, POC: >=300 mg/dL — AB
Spec Grav, UA: <=1.005 — AB (ref 1.010–1.025)
Urobilinogen, UA: >=8 E.U./dL — AB
pH, UA: 5 (ref 5.0–8.0)

## 2022-07-21 MED ORDER — SULFAMETHOXAZOLE-TRIMETHOPRIM 800-160 MG PO TABS: 1.0000 | ORAL_TABLET | Freq: Two times a day (BID) | ORAL | 0 refills | Status: AC

## 2022-07-21 NOTE — ED Triage Notes (Signed)
Pt states that she has had AZO last night (7/28) and today (7/29).  Culture was ordered along with UA dipstick.

## 2022-07-21 NOTE — ED Triage Notes (Signed)
Pt states that she has some abdominal pain and urinary urgency. X1 day

## 2022-07-21 NOTE — ED Provider Notes (Signed)
[facility]    CSN: [id number] Arrival date & time: 07/21/22  0854      History   Chief Complaint Chief Complaint  Patient presents with   Abdominal Pain    Abdominal pain and urinary urgency x1 day   HPI Kristi Grant is a 88 y.o. female.   Presenting today with 1 day history of lower abdominal pain, urinary urgency.  Denies fever, chills, nausea, vomiting, diarrhea, dysuria, hematuria.  Has been taking Azo with mild relief since onset.  History of frequent urinary tract infections.   Past Medical History:  Diagnosis Date   Arthritis    Blood transfusion without reported diagnosis    Cataract    Hypertension    Neuromuscular disorder [facility])    Patient Active Problem List   Diagnosis Date Noted   Gout 01/24/2016   Osteopenia 01/24/2016   History of left knee replacement 06/28/2015   Essential hypertension, benign 09/01/2013   Hyperlipemia 09/01/2013   UTI (urinary tract infection) 06/28/2013   Past Surgical History:  Procedure Laterality Date   ABDOMINAL HYSTERECTOMY     BASAL CELL CARCINOMA EXCISION  01/2011   nose   CATARACT EXTRACTION     Ovarian wedge resection  1962   REPLACEMENT TOTAL KNEE Left 05/2010   VAGINAL HYSTERECTOMY  1990   OB History   No obstetric history on file.     Home Medications    Prior to Admission medications   Medication Sig Start Date End Date Taking? Authorizing Provider  sulfamethoxazole-trimethoprim (BACTRIM DS) 800-160 MG tablet Take 1 tablet by mouth 2 (two) times daily for 3 days. 07/21/22 07/24/22 Yes Particia Nearing, PA-C  allopurinol (ZYLOPRIM) 100 MG tablet Take 1 tablet (100 mg total) by mouth daily. 06/05/22   [employer], Alvino Chapel, NP  amLODipine (NORVASC) 2.5 MG tablet TAKE ONE TABLET BY MOUTH ONCE DAILY. 02/28/22   Babs Sciara, MD  Coenzyme Q10 (COQ-10) 100 MG CAPS Take by mouth daily.    [provider]  Methylcobalamin (B12-ACTIVE PO) Take by mouth.    [provider]  OVER THE  COUNTER MEDICATION Vit d daily    [provider]  pravastatin (PRAVACHOL) 20 MG tablet Take 1 tablet (20 mg total) by mouth daily. 06/05/22   [employer], Alvino Chapel, NP    Family History Family History  Problem Relation Age of Onset   Heart disease Mother    Heart disease Father    Breast cancer Sister    Cirrhosis Sister    Diabetes Sister    Breast cancer Maternal Grandmother    Ovarian cancer Maternal Grandmother     Social History Social History   Tobacco Use   Smoking status: Never   Smokeless tobacco: Never  Substance Use Topics   Alcohol use: No   Drug use: No     Allergies   Clindamycin/lincomycin, Ciprofloxacin, Flagyl [metronidazole], Hydrocodone, Iohexol, Nucynta [tapentadol], and Rocephin [ceftriaxone sodium in dextrose]   Review of Systems Review of Systems Per HPI  Physical Exam Triage Vital Signs ED Triage Vitals  Enc Vitals Group     BP 07/21/22 1006 (!) 163/82     Pulse Rate 07/21/22 1006 93     Resp 07/21/22 1006 18     Temp 07/21/22 1006 98 F (36.7 C)     Temp Source 07/21/22 1006 Oral     SpO2 07/21/22 1006 94 %     Weight 07/21/22 1004 175 lb (79.4 kg)  Height 07/21/22 1004 5\' 8"  (1.727 m)     Head Circumference --      Peak Flow --      Pain Score 07/21/22 1004 2     Pain Loc --      Pain Edu? --      Excl. in GC? --    No data found.  Updated Vital Signs BP (!) 163/82 (BP Location: Right Arm)   Pulse 93   Temp 98 F (36.7 C) (Oral)   Resp 18   Ht 5\' 8"  (1.727 m)   Wt 175 lb (79.4 kg)   SpO2 94%   BMI 26.61 kg/m   Visual Acuity Right Eye Distance:   Left Eye Distance:   Bilateral Distance:    Right Eye Near:   Left Eye Near:    Bilateral Near:     Physical Exam Vitals and nursing note reviewed.  Constitutional:      Appearance: Normal appearance. She is not ill-appearing.  HENT:     Head: Atraumatic.     Mouth/Throat:     Mouth: Mucous membranes are moist.  Eyes:     Extraocular Movements:  Extraocular movements intact.     Conjunctiva/sclera: Conjunctivae normal.  Cardiovascular:     Rate and Rhythm: Normal rate and regular rhythm.     Heart sounds: Normal heart sounds.  Pulmonary:     Effort: Pulmonary effort is normal.     Breath sounds: Normal breath sounds.  Abdominal:     General: Bowel sounds are normal. There is no distension.     Palpations: Abdomen is soft.     Tenderness: There is no abdominal tenderness. There is no right CVA tenderness, left CVA tenderness or guarding.  Musculoskeletal:        General: Normal range of motion.     Cervical back: Normal range of motion and neck supple.  Skin:    General: Skin is warm and dry.  Neurological:     Mental Status: She is alert and oriented to person, place, and time.  Psychiatric:        Mood and Affect: Mood normal.        Thought Content: Thought content normal.        Judgment: Judgment normal.    UC Treatments / Results  Labs (all labs ordered are listed, but only abnormal results are displayed) Labs Reviewed  POCT URINALYSIS DIP (MANUAL ENTRY) - Abnormal; Notable for the following components:      Result Value   Color, UA orange (*)    Glucose, UA =250 (*)    Bilirubin, UA large (*)    Ketones, POC UA moderate (40) (*)    Spec Grav, UA <=1.005 (*)    Blood, UA large (*)    Protein Ur, POC >=300 (*)    Urobilinogen, UA >=8.0 (*)    Nitrite, UA Positive (*)    Leukocytes, UA Large (3+) (*)    All other components within normal limits  URINE CULTURE    EKG  Radiology No results found.  Procedures Procedures (including critical care time)  Medications Ordered in UC Medications - No data to display  Initial Impression / Assessment and Plan / UC Course  I have reviewed the triage vital signs and the nursing notes.  Pertinent labs & imaging results that were available during my care of the patient were reviewed by me and considered in my medical decision making (see chart for details).  Urinalysis significantly abnormal likely secondary to Azo use, given her history of urinary tract infection and symptoms will cover with antibiotics empirically while awaiting urine culture.  Adjust as needed based on urine culture results.  Return for any worsening symptoms.  Push fluids.  Final Clinical Impressions(s) / UC Diagnoses   Final diagnoses:  Abdominal pain, unspecified abdominal location  Acute lower UTI   Discharge Instructions   None    ED Prescriptions     Medication Sig Dispense Auth. Provider   sulfamethoxazole-trimethoprim (BACTRIM DS) 800-160 MG tablet Take 1 tablet by mouth 2 (two) times daily for 3 days. 6 tablet Particia Nearing, [place]      PDMP not reviewed this encounter.   Particia Nearing, [place] 07/21/22 1029

## 2022-07-22 LAB — URINE CULTURE

## 2022-07-31 ENCOUNTER — Ambulatory Visit (HOSPITAL_COMMUNITY)
Admission: RE | Admit: 2022-07-31 | Discharge: 2022-07-31 | Disposition: A | Discharge status: Home / Self Care | Payer: Medicare PPO | Source: Ambulatory Visit | Attending: Family Medicine | Admitting: Family Medicine

## 2022-07-31 DIAGNOSIS — M8580 Other specified disorders of bone density and structure, unspecified site: Secondary | ICD-10-CM | POA: Diagnosis not present

## 2022-07-31 DIAGNOSIS — M85851 Other specified disorders of bone density and structure, right thigh: Secondary | ICD-10-CM | POA: Diagnosis not present

## 2022-07-31 DIAGNOSIS — Z780 Asymptomatic menopausal state: Secondary | ICD-10-CM | POA: Diagnosis not present

## 2022-09-03 DIAGNOSIS — M8580 Other specified disorders of bone density and structure, unspecified site: Secondary | ICD-10-CM | POA: Diagnosis not present

## 2022-09-03 DIAGNOSIS — R5383 Other fatigue: Secondary | ICD-10-CM | POA: Diagnosis not present

## 2022-09-03 DIAGNOSIS — E7849 Other hyperlipidemia: Secondary | ICD-10-CM | POA: Diagnosis not present

## 2022-09-03 DIAGNOSIS — I950 Idiopathic hypotension: Secondary | ICD-10-CM | POA: Diagnosis not present

## 2022-09-03 DIAGNOSIS — G909 Disorder of the autonomic nervous system, unspecified: Secondary | ICD-10-CM | POA: Diagnosis not present

## 2022-09-03 DIAGNOSIS — R0609 Other forms of dyspnea: Secondary | ICD-10-CM | POA: Diagnosis not present

## 2022-09-04 LAB — CBC WITH DIFFERENTIAL/PLATELET
Basophils Absolute: 0 10*3/uL (ref 0.0–0.2)
Basos: 0 %
EOS (ABSOLUTE): 0.1 10*3/uL (ref 0.0–0.4)
Eos: 1 %
Hematocrit: 34.8 % (ref 34.0–46.6)
Hemoglobin: 11.8 g/dL (ref 11.1–15.9)
Immature Grans (Abs): 0.1 10*3/uL (ref 0.0–0.1)
Immature Granulocytes: 2 %
Lymphocytes Absolute: 4 10*3/uL — ABNORMAL HIGH (ref 0.7–3.1)
Lymphs: 53 %
MCH: 31.6 pg (ref 26.6–33.0)
MCHC: 33.9 g/dL (ref 31.5–35.7)
MCV: 93 fL (ref 79–97)
Monocytes Absolute: 0.5 10*3/uL (ref 0.1–0.9)
Monocytes: 7 %
Neutrophils Absolute: 2.8 10*3/uL (ref 1.4–7.0)
Neutrophils: 37 %
Platelets: 246 10*3/uL (ref 150–450)
RBC: 3.73 x10E6/uL — ABNORMAL LOW (ref 3.77–5.28)
RDW: 13.4 % (ref 11.7–15.4)
WBC: 7.5 10*3/uL (ref 3.4–10.8)

## 2022-09-04 LAB — BASIC METABOLIC PANEL
BUN/Creatinine Ratio: 24 (ref 12–28)
BUN: 26 mg/dL (ref 8–27)
CO2: 22 mmol/L (ref 20–29)
Calcium: 9.7 mg/dL (ref 8.7–10.3)
Chloride: 100 mmol/L (ref 96–106)
Creatinine, Ser: 1.08 mg/dL — ABNORMAL HIGH (ref 0.57–1.00)
Glucose: 97 mg/dL (ref 70–99)
Potassium: 4.4 mmol/L (ref 3.5–5.2)
Sodium: 137 mmol/L (ref 134–144)
eGFR: 50 mL/min/{1.73_m2} — ABNORMAL LOW (ref >59)

## 2022-09-04 LAB — HEPATIC FUNCTION PANEL
ALT: 12 [IU]/L (ref 0–32)
AST: 21 [IU]/L (ref 0–40)
Albumin: 4.6 g/dL (ref 3.7–4.7)
Alkaline Phosphatase: 65 [IU]/L (ref 44–121)
Bilirubin Total: 0.3 mg/dL (ref 0.0–1.2)
Bilirubin, Direct: <0.1 mg/dL (ref 0.00–0.40)
Total Protein: 6.9 g/dL (ref 6.0–8.5)

## 2022-09-04 LAB — LIPID PANEL
Chol/HDL Ratio: 3.5 ratio (ref 0.0–4.4)
Cholesterol, Total: 201 mg/dL — ABNORMAL HIGH (ref 100–199)
HDL: 57 mg/dL (ref >39)
LDL Chol Calc (NIH): 111 mg/dL — ABNORMAL HIGH (ref 0–99)
Triglycerides: 190 mg/dL — ABNORMAL HIGH (ref 0–149)
VLDL Cholesterol Cal: 33 mg/dL (ref 5–40)

## 2022-09-04 LAB — BRAIN NATRIURETIC PEPTIDE: BNP: 63.4 pg/mL (ref 0.0–100.0)

## 2022-09-07 ENCOUNTER — Ambulatory Visit: Payer: Medicare PPO | Admitting: Family Medicine

## 2022-09-07 ENCOUNTER — Encounter: Payer: Self-pay | Admitting: Family Medicine

## 2022-09-07 VITALS — BP 138/80 | Wt 174.2 lb

## 2022-09-07 DIAGNOSIS — R252 Cramp and spasm: Secondary | ICD-10-CM | POA: Diagnosis not present

## 2022-09-07 DIAGNOSIS — E559 Vitamin D deficiency, unspecified: Secondary | ICD-10-CM | POA: Diagnosis not present

## 2022-09-07 DIAGNOSIS — Z23 Encounter for immunization: Secondary | ICD-10-CM

## 2022-09-07 DIAGNOSIS — N289 Disorder of kidney and ureter, unspecified: Secondary | ICD-10-CM | POA: Diagnosis not present

## 2022-09-07 DIAGNOSIS — M8580 Other specified disorders of bone density and structure, unspecified site: Secondary | ICD-10-CM | POA: Diagnosis not present

## 2022-09-07 NOTE — Progress Notes (Signed)
   Subjective:    Patient ID: Kristi Grant, female    DOB: Nov 17, 1934, 88 y.o.   MRN: [id number]  HPI Pt arrives to discuss bone density. Pt had bone density completed on 07/31/22. Patient comes in today with multiple different things that we have discussed #1 having cramps and discomfort in her calves and feet intermittently sometimes into the evening.  Does not cause any other particular setbacks. 2.  She noticed that her recent blood work had a couple different issues going on Red blood cells slightly low but hemoglobin good and creatinine slightly elevated she is worried about chronic kidney disease she does have a history of hypertension #3 patient is using a very small dose of amlodipine about every fourth day and avoiding it on hot days because she had low blood pressures her blood pressure readings have ranged from 100/68 to 136/86 she denies any chest pain or shortness of breath 4.  Recent bone density showed osteopenia so we are here to discuss her FRAX score  Review of Systems     Objective:   Physical Exam  General-in no acute distress Eyes-no discharge Lungs-respiratory rate normal, CTA CV-no murmurs,RRR Extremities skin warm dry no edema Neuro grossly normal Behavior normal, alert       Assessment & Plan:  1. Renal insufficiency So her GFR is slightly low but this mild change with creatinine does signify possible CKD stage III but we need to repeat metabolic 7 in the middle of the day along with urine ACR to some degree this can be mild normal aging of the kidney and not being in a dangerous range I did encourage her to eat healthy and also to avoid all NSAIDs and keep her blood pressure under good control - Basic metabolic panel - Microalbumin/Creatinine Ratio, Urine - VITAMIN D 25 Hydroxy (Vit-D Deficiency, Fractures)  2. Vitamin D deficiency She has osteopenia we will check vitamin D level - Basic metabolic panel - Microalbumin/Creatinine Ratio, Urine - VITAMIN  D 25 Hydroxy (Vit-D Deficiency, Fractures)  3. Osteopenia, unspecified location Osteopenia elevated FRAX score we discussed what this means we discussed options regarding medication we are leaning toward Fosamax but want to see the blood work first - Basic metabolic panel - Microalbumin/Creatinine Ratio, Urine - VITAMIN D 25 Hydroxy (Vit-D Deficiency, Fractures)  4. Leg cramps Gentle stretching would be the best approach recent electrolytes look good - Basic metabolic panel - Microalbumin/Creatinine Ratio, Urine - VITAMIN D 25 Hydroxy (Vit-D Deficiency, Fractures)  5. Need for vaccination Flu vaccine today - Flu Vaccine QUAD High Dose(Fluad)  Rec COVID-vaccine Recommend consideration for RSV vaccine Had discussion regarding whether or not to continue mammograms on a yearly basis and we settled on the approach of every other year

## 2022-09-11 ENCOUNTER — Ambulatory Visit (HOSPITAL_COMMUNITY)
Admission: RE | Admit: 2022-09-11 | Discharge: 2022-09-11 | Disposition: A | Discharge status: Home / Self Care | Payer: Medicare PPO | Source: Ambulatory Visit | Attending: Family Medicine | Admitting: Family Medicine

## 2022-09-11 DIAGNOSIS — R0609 Other forms of dyspnea: Secondary | ICD-10-CM | POA: Insufficient documentation

## 2022-09-11 LAB — ECHOCARDIOGRAM COMPLETE
AR max vel: 2.48 cm2
AV Area VTI: 2.57 cm2
AV Area mean vel: 2.64 cm2
AV Mean grad: 7.2 mmHg
AV Peak grad: 13.1 mmHg
Ao pk vel: 1.81 m/s
Area-P 1/2: 2.48 cm2
S' Lateral: 2.6 cm

## 2022-09-11 NOTE — Progress Notes (Signed)
*  PRELIMINARY RESULTS* Echocardiogram 2D Echocardiogram has been performed.  Kristi Grant 09/11/2022, 1:48 PM

## 2022-09-13 NOTE — Progress Notes (Signed)
Amb

## 2022-09-13 NOTE — Addendum Note (Signed)
Addended by: Dairl Ponder on: 09/13/2022 10:53 AM   Modules accepted: Orders

## 2022-09-18 ENCOUNTER — Other Ambulatory Visit: Payer: Self-pay | Admitting: Family Medicine

## 2022-09-21 DIAGNOSIS — N289 Disorder of kidney and ureter, unspecified: Secondary | ICD-10-CM | POA: Diagnosis not present

## 2022-09-21 DIAGNOSIS — E559 Vitamin D deficiency, unspecified: Secondary | ICD-10-CM | POA: Diagnosis not present

## 2022-09-21 DIAGNOSIS — M8580 Other specified disorders of bone density and structure, unspecified site: Secondary | ICD-10-CM | POA: Diagnosis not present

## 2022-09-21 DIAGNOSIS — R252 Cramp and spasm: Secondary | ICD-10-CM | POA: Diagnosis not present

## 2022-09-25 LAB — BASIC METABOLIC PANEL
BUN/Creatinine Ratio: 21 (ref 12–28)
BUN: 23 mg/dL (ref 8–27)
CO2: 20 mmol/L (ref 20–29)
Calcium: 9.6 mg/dL (ref 8.7–10.3)
Chloride: 96 mmol/L (ref 96–106)
Creatinine, Ser: 1.07 mg/dL — ABNORMAL HIGH (ref 0.57–1.00)
Glucose: 202 mg/dL — ABNORMAL HIGH (ref 70–99)
Potassium: 4 mmol/L (ref 3.5–5.2)
Sodium: 137 mmol/L (ref 134–144)
eGFR: 51 mL/min/{1.73_m2} — ABNORMAL LOW (ref >59)

## 2022-09-25 LAB — MICROALBUMIN / CREATININE URINE RATIO
Creatinine, Urine: 67.7 mg/dL
Microalb/Creat Ratio: 43 mg/g creat — ABNORMAL HIGH (ref 0–29)
Microalbumin, Urine: 29.4 ug/mL

## 2022-09-25 LAB — VITAMIN D 25 HYDROXY (VIT D DEFICIENCY, FRACTURES): Vit D, 25-Hydroxy: 41.1 ng/mL (ref 30.0–100.0)

## 2022-10-01 ENCOUNTER — Ambulatory Visit
Admission: EM | Admit: 2022-10-01 | Discharge: 2022-10-01 | Disposition: A | Discharge status: Home / Self Care | Payer: Medicare PPO | Attending: Nurse Practitioner | Admitting: Nurse Practitioner

## 2022-10-01 DIAGNOSIS — I10 Essential (primary) hypertension: Secondary | ICD-10-CM | POA: Diagnosis not present

## 2022-10-01 DIAGNOSIS — N390 Urinary tract infection, site not specified: Secondary | ICD-10-CM | POA: Diagnosis not present

## 2022-10-01 LAB — POCT URINALYSIS DIP (MANUAL ENTRY)
Glucose, UA: 250 mg/dL — AB
Nitrite, UA: POSITIVE — AB
Protein Ur, POC: >=300 mg/dL — AB
Spec Grav, UA: 1.015 (ref 1.010–1.025)
Urobilinogen, UA: 4 E.U./dL — AB
pH, UA: 5 (ref 5.0–8.0)

## 2022-10-01 MED ORDER — FOSFOMYCIN TROMETHAMINE 3 G PO PACK: 3.0000 g | PACK | Freq: Once | ORAL | 0 refills | Status: AC

## 2022-10-01 NOTE — ED Triage Notes (Signed)
Pt reports 5:30 this morning bladder felt full, had bladder spasms until arrived here at [facility]. Also repots pain when urinating. Took azo gave some relief.

## 2022-10-01 NOTE — ED Provider Notes (Signed)
[facility]    CSN: [id number] Arrival date & time: 10/01/22  0932      History   Chief Complaint No chief complaint on file.   HPI Kristi Grant is a 88 y.o. female.   Patient presents for a few hours of urinary pressure, urgency, burning with urination, and lower abdominal pain.  She denies new urinary incontinence, change in the odor of her urine, hematuria, new back or flank pain, fever, nausea/vomiting, or vaginal discharge.  Reports she has taken Azo with resolution of symptoms.  Reports last UTI was approximately 2 months ago and her symptoms fully improved after the Bactrim.  No change in appetite, diarrhea, or abdominal pain.  Blood pressure is also elevated today; patient reports her primary care doctor just took her off of low-dose amlodipine due to hypotension.  She takes blood pressure at home on regular basis.  No chest pain, shortness of breath, headache, vision changes, dizziness/lightheadedness, or lower extremity swelling today.    Past Medical History:  Diagnosis Date   Arthritis    Blood transfusion without reported diagnosis    Cataract    Hypertension    Neuromuscular disorder [facility])     Patient Active Problem List   Diagnosis Date Noted   Gout 01/24/2016   Osteopenia 01/24/2016   History of left knee replacement 06/28/2015   Essential hypertension, benign 09/01/2013   Hyperlipemia 09/01/2013   UTI (urinary tract infection) 06/28/2013    Past Surgical History:  Procedure Laterality Date   ABDOMINAL HYSTERECTOMY     BASAL CELL CARCINOMA EXCISION  01/2011   nose   CATARACT EXTRACTION     Ovarian wedge resection  1962   REPLACEMENT TOTAL KNEE Left 05/2010   VAGINAL HYSTERECTOMY  1990    OB History   No obstetric history on file.      Home Medications    Prior to Admission medications   Medication Sig Start Date End Date Taking? Authorizing Provider  cholecalciferol (VITAMIN D3) 25 MCG (1000 UNIT) tablet Take 1,000 Units by  mouth daily.   Yes [provider]  fosfomycin (MONUROL) 3 g PACK Take 3 g by mouth once for 1 dose. 10/01/22 10/01/22 Yes Eulogio Bear, NP  allopurinol (ZYLOPRIM) 100 MG tablet Take 1 tablet (100 mg total) by mouth daily. 06/05/22   [employer]med[employer], Trenton Gammon, NP  amLODipine (NORVASC) 2.5 MG tablet TAKE ONE TABLET BY MOUTH ONCE DAILY. 02/28/22   Kathyrn Drown, MD  Coenzyme Q10 (COQ-10) 100 MG CAPS Take by mouth daily.    [provider]  Methylcobalamin (B12-ACTIVE PO) Take by mouth.    [provider]  OVER THE COUNTER MEDICATION Vit d daily    [provider]  pravastatin (PRAVACHOL) 20 MG tablet TAKE ONE TABLET BY MOUTH ONCE DAILY. 09/18/22   Kathyrn Drown, MD    Family History Family History  Problem Relation Age of Onset   Heart disease Mother    Heart disease Father    Breast cancer Sister    Cirrhosis Sister    Diabetes Sister    Breast cancer Maternal Grandmother    Ovarian cancer Maternal Grandmother     Social History Social History   Tobacco Use   Smoking status: Never   Smokeless tobacco: Never  Substance Use Topics   Alcohol use: No   Drug use: No     Allergies   Clindamycin/lincomycin, Ciprofloxacin, Flagyl [metronidazole], Hydrocodone, Iohexol, Nucynta [tapentadol], and Rocephin [ceftriaxone sodium in dextrose]  Review of Systems Review of Systems Per HPI  Physical Exam Triage Vital Signs ED Triage Vitals  Enc Vitals Group     BP 10/01/22 1038 (!) 195/92     Pulse Rate 10/01/22 1038 77     Resp 10/01/22 1038 18     Temp 10/01/22 1038 97.9 F (36.6 C)     Temp Source 10/01/22 1038 Oral     SpO2 10/01/22 1038 98 %     Weight --      Height --      Head Circumference --      Peak Flow --      Pain Score 10/01/22 1040 0     Pain Loc --      Pain Edu? --      Excl. in [place]? --    No data found.  Updated Vital Signs BP (!) 178/99 (BP Location: Right Arm)   Pulse 77   Temp 97.9 F (36.6 C) (Oral)   Resp 18    SpO2 98%   Visual Acuity Right Eye Distance:   Left Eye Distance:   Bilateral Distance:    Right Eye Near:   Left Eye Near:    Bilateral Near:     Physical Exam Vitals and nursing note reviewed.  Constitutional:      General: She is not in acute distress.    Appearance: She is not toxic-appearing.  Abdominal:     General: Abdomen is flat. Bowel sounds are normal. There is no distension.     Palpations: Abdomen is soft. There is no mass.     Tenderness: There is no abdominal tenderness. There is no right CVA tenderness, left CVA tenderness or guarding.  Skin:    General: Skin is warm and dry.     Coloration: Skin is not jaundiced or pale.     Findings: No erythema.  Neurological:     Mental Status: She is alert and oriented to person, place, and time.     Motor: No weakness.     Gait: Gait normal.  Psychiatric:        Behavior: Behavior is cooperative.      UC Treatments / Results  Labs (all labs ordered are listed, but only abnormal results are displayed) Labs Reviewed  POCT URINALYSIS DIP (MANUAL ENTRY) - Abnormal; Notable for the following components:      Result Value   Color, UA red (*)    Clarity, UA cloudy (*)    Glucose, UA =250 (*)    Bilirubin, UA small (*)    Ketones, POC UA trace (5) (*)    Blood, UA large (*)    Protein Ur, POC >=300 (*)    Urobilinogen, UA 4.0 (*)    Nitrite, UA Positive (*)    Leukocytes, UA Large (3+) (*)    All other components within normal limits    EKG   Radiology No results found.  Procedures Procedures (including critical care time)  Medications Ordered in UC Medications - No data to display  Initial Impression / Assessment and Plan / UC Course  I have reviewed the triage vital signs and the nursing notes.  Pertinent labs & imaging results that were available during my care of the patient were reviewed by me and considered in my medical decision making (see chart for details).    Patient is well-appearing,  afebrile, not tachycardic, not tachypneic, oxygenating well on room air.  She is initially quite hypertensive, however better with recheck  after rest today in urgent care.  Recommended rechecking while at home and close follow-up with PCP with no improvement in blood pressure.  ER precautions discussed.  Regarding UTI symptoms, urinalysis is unreliable today secondary to Azo use.  We will treat empirically with fosfomycin as patient has had a history of Pseudomonas aeruginosa and was recently treated with Bactrim 2 months ago.  Urine culture pending.  ER and return precautions discussed.  The patient was given the opportunity to ask questions.  All questions answered to their satisfaction.  The patient is in agreement to this plan.    Final Clinical Impressions(s) / UC Diagnoses   Final diagnoses:  Acute lower UTI  Essential hypertension, benign     Discharge Instructions      Please take the fosfomycin as prescribed for UTI.  This is a one time treatment and should take care of your symptoms.  We will call you in a couple of days if the urine culture shows we need to prescribe a different treatment.   Blood pressure is a little elevated today, please check BP when you get home and if it stays high, follow up with Dr. Wolfgang Phoenix.   If your symptoms worsen, go to ER.     ED Prescriptions     Medication Sig Dispense Auth. Provider   fosfomycin (MONUROL) 3 g PACK Take 3 g by mouth once for 1 dose. 3 g Eulogio Bear, NP      PDMP not reviewed this encounter.   Eulogio Bear, NP 10/01/22 1150

## 2022-10-01 NOTE — Discharge Instructions (Addendum)
Please take the fosfomycin as prescribed for UTI.  This is a one time treatment and should take care of your symptoms.  We will call you in a couple of days if the urine culture shows we need to prescribe a different treatment.   Blood pressure is a little elevated today, please check BP when you get home and if it stays high, follow up with Dr. Wolfgang Phoenix.   If your symptoms worsen, go to ER.

## 2022-10-04 LAB — URINE CULTURE: Culture: >=100000 — AB

## 2022-10-06 ENCOUNTER — Telehealth: Payer: Self-pay | Admitting: Family Medicine

## 2022-10-06 NOTE — Telephone Encounter (Signed)
Patient recently in urgent care her blood pressure was elevated when she was there please reach up to her and schedule her a follow-up visit within 30 days with me thank you sooner if her blood pressures are running very high

## 2022-10-13 ENCOUNTER — Ambulatory Visit
Admission: RE | Admit: 2022-10-13 | Discharge: 2022-10-13 | Disposition: A | Discharge status: Home / Self Care | Payer: Medicare PPO | Source: Ambulatory Visit | Attending: Family Medicine | Admitting: Family Medicine

## 2022-10-13 VITALS — BP 170/92 | HR 87 | Temp 98.7°F | Resp 18

## 2022-10-13 DIAGNOSIS — N390 Urinary tract infection, site not specified: Secondary | ICD-10-CM

## 2022-10-13 LAB — POCT URINALYSIS DIP (MANUAL ENTRY)
Glucose, UA: 250 mg/dL — AB
Nitrite, UA: POSITIVE — AB
Protein Ur, POC: 100 mg/dL — AB
Spec Grav, UA: <=1.005 — AB (ref 1.010–1.025)
Urobilinogen, UA: 4 E.U./dL — AB
pH, UA: 5.5 (ref 5.0–8.0)

## 2022-10-13 MED ORDER — SULFAMETHOXAZOLE-TRIMETHOPRIM 800-160 MG PO TABS: 1.0000 | ORAL_TABLET | Freq: Two times a day (BID) | ORAL | 0 refills | Status: AC

## 2022-10-13 NOTE — ED Provider Notes (Signed)
[facility]    CSN: [id number] Arrival date & time: 10/13/22  1130      History   Chief Complaint Chief Complaint  Patient presents with   Urinary Frequency    UTI. Spasms of bladder - Entered by patient    HPI Kristi Grant is a 88 y.o. female.   Presenting today with new onset dysuria, urinary frequency since this morning around 3 AM.  Denies hematuria, flank pain, abdominal pain, nausea, vomiting, vaginal symptoms.  Took some Azo after onset of symptoms with some mild relief.  History of urinary tract infections that have felt similar.    Past Medical History:  Diagnosis Date   Arthritis    Blood transfusion without reported diagnosis    Cataract    Hypertension    Neuromuscular disorder [facility])     Patient Active Problem List   Diagnosis Date Noted   Gout 01/24/2016   Osteopenia 01/24/2016   History of left knee replacement 06/28/2015   Essential hypertension, benign 09/01/2013   Hyperlipemia 09/01/2013   UTI (urinary tract infection) 06/28/2013    Past Surgical History:  Procedure Laterality Date   ABDOMINAL HYSTERECTOMY     BASAL CELL CARCINOMA EXCISION  01/2011   nose   CATARACT EXTRACTION     Ovarian wedge resection  1962   REPLACEMENT TOTAL KNEE Left 05/2010   VAGINAL HYSTERECTOMY  1990    OB History   No obstetric history on file.      Home Medications    Prior to Admission medications   Medication Sig Start Date End Date Taking? Authorizing Provider  allopurinol (ZYLOPRIM) 100 MG tablet Take 1 tablet (100 mg total) by mouth daily. 06/05/22  Yes [employer], Alvino Chapel, NP  amLODipine (NORVASC) 2.5 MG tablet TAKE ONE TABLET BY MOUTH ONCE DAILY. 02/28/22  Yes Babs Sciara, MD  cholecalciferol (VITAMIN D3) 25 MCG (1000 UNIT) tablet Take 1,000 Units by mouth daily.   Yes [provider]  Coenzyme Q10 (COQ-10) 100 MG CAPS Take by mouth daily.   Yes [provider]  Methylcobalamin (B12-ACTIVE PO) Take by mouth.   Yes  [provider]  OVER THE COUNTER MEDICATION Vit d daily   Yes [provider]  pravastatin (PRAVACHOL) 20 MG tablet TAKE ONE TABLET BY MOUTH ONCE DAILY. 09/18/22  Yes Babs Sciara, MD  sulfamethoxazole-trimethoprim (BACTRIM DS) 800-160 MG tablet Take 1 tablet by mouth 2 (two) times daily for 3 days. 10/13/22 10/16/22 Yes Particia Nearing, PA-C    Family History Family History  Problem Relation Age of Onset   Heart disease Mother    Heart disease Father    Breast cancer Sister    Cirrhosis Sister    Diabetes Sister    Breast cancer Maternal Grandmother    Ovarian cancer Maternal Grandmother     Social History Social History   Tobacco Use   Smoking status: Never   Smokeless tobacco: Never  Substance Use Topics   Alcohol use: No   Drug use: No     Allergies   Clindamycin/lincomycin, Ciprofloxacin, Flagyl [metronidazole], Hydrocodone, Iohexol, Nucynta [tapentadol], and Rocephin [ceftriaxone sodium in dextrose]   Review of Systems Review of Systems Per HPI  Physical Exam Triage Vital Signs ED Triage Vitals  Enc Vitals Group     BP 10/13/22 1203 (!) 170/92     Pulse Rate 10/13/22 1203 87     Resp 10/13/22 1203 18     Temp 10/13/22 1203 98.7 F (  37.1 C)     Temp Source 10/13/22 1203 Oral     SpO2 10/13/22 1203 93 %     Weight --      Height --      Head Circumference --      Peak Flow --      Pain Score 10/13/22 1204 5     Pain Loc --      Pain Edu? --      Excl. in [place]? --    No data found.  Updated Vital Signs BP (!) 170/92 (BP Location: Right Arm)   Pulse 87   Temp 98.7 F (37.1 C) (Oral)   Resp 18   SpO2 93%   Visual Acuity Right Eye Distance:   Left Eye Distance:   Bilateral Distance:    Right Eye Near:   Left Eye Near:    Bilateral Near:     Physical Exam Vitals and nursing note reviewed.  Constitutional:      Appearance: Normal appearance. She is not ill-appearing.  HENT:     Head: Atraumatic.      Mouth/Throat:     Mouth: Mucous membranes are moist.  Eyes:     Extraocular Movements: Extraocular movements intact.     Conjunctiva/sclera: Conjunctivae normal.  Cardiovascular:     Rate and Rhythm: Normal rate and regular rhythm.     Heart sounds: Normal heart sounds.  Pulmonary:     Effort: Pulmonary effort is normal.     Breath sounds: Normal breath sounds.  Abdominal:     General: Bowel sounds are normal. There is no distension.     Palpations: Abdomen is soft.     Tenderness: There is no abdominal tenderness. There is no right CVA tenderness, left CVA tenderness or guarding.  Musculoskeletal:        General: Normal range of motion.     Cervical back: Normal range of motion and neck supple.  Skin:    General: Skin is warm and dry.  Neurological:     Mental Status: She is alert and oriented to person, place, and time.  Psychiatric:        Mood and Affect: Mood normal.        Thought Content: Thought content normal.        Judgment: Judgment normal.      UC Treatments / Results  Labs (all labs ordered are listed, but only abnormal results are displayed) Labs Reviewed  POCT URINALYSIS DIP (MANUAL ENTRY) - Abnormal; Notable for the following components:      Result Value   Color, UA orange (*)    Clarity, UA cloudy (*)    Glucose, UA =250 (*)    Bilirubin, UA small (*)    Ketones, POC UA trace (5) (*)    Spec Grav, UA <=1.005 (*)    Blood, UA large (*)    Protein Ur, POC =100 (*)    Urobilinogen, UA 4.0 (*)    Nitrite, UA Positive (*)    Leukocytes, UA Large (3+) (*)    All other components within normal limits  URINE CULTURE    EKG   Radiology No results found.  Procedures Procedures (including critical care time)  Medications Ordered in UC Medications - No data to display  Initial Impression / Assessment and Plan / UC Course  I have reviewed the triage vital signs and the nursing notes.  Pertinent labs & imaging results that were available during  my care of the patient  were reviewed by me and considered in my medical decision making (see chart for details).     Urinalysis results altered today due to use of Azo, however given consistent symptoms and history of urinary tract infections will cover with Bactrim while awaiting urine culture results for confirmation and further guidance.  Push fluids, Azo as needed and return for any worsening symptoms. Final Clinical Impressions(s) / UC Diagnoses   Final diagnoses:  Acute lower UTI   Discharge Instructions   None    ED Prescriptions     Medication Sig Dispense Auth. Provider   sulfamethoxazole-trimethoprim (BACTRIM DS) 800-160 MG tablet Take 1 tablet by mouth 2 (two) times daily for 3 days. 6 tablet Particia Nearing, [place]      PDMP not reviewed this encounter.   Particia Nearing, [place] 10/13/22 1235

## 2022-10-13 NOTE — ED Triage Notes (Signed)
Patient c/o UTI, dysuria, abdominal cramping since this morning.  Patient has taken AZO to help.

## 2022-10-15 LAB — URINE CULTURE: Culture: >=100000 — AB

## 2022-11-02 ENCOUNTER — Ambulatory Visit
Admission: RE | Admit: 2022-11-02 | Discharge: 2022-11-02 | Disposition: A | Discharge status: Home / Self Care | Payer: Medicare PPO | Source: Ambulatory Visit | Attending: Nurse Practitioner | Admitting: Nurse Practitioner

## 2022-11-02 VITALS — BP 171/86 | HR 79 | Temp 99.4°F | Resp 17

## 2022-11-02 DIAGNOSIS — N390 Urinary tract infection, site not specified: Secondary | ICD-10-CM | POA: Insufficient documentation

## 2022-11-02 LAB — POCT URINALYSIS DIP (MANUAL ENTRY)
Glucose, UA: 100 mg/dL — AB
Nitrite, UA: POSITIVE — AB
Protein Ur, POC: >=300 mg/dL — AB
Spec Grav, UA: 1.025 (ref 1.010–1.025)
Urobilinogen, UA: 1 E.U./dL
pH, UA: 6.5 (ref 5.0–8.0)

## 2022-11-02 MED ORDER — FOSFOMYCIN TROMETHAMINE 3 G PO PACK: 3.0000 g | PACK | ORAL | 0 refills | Status: AC

## 2022-11-02 NOTE — ED Provider Notes (Signed)
[facility]    CSN: [id number] Arrival date & time: 11/02/22  0906      History   Chief Complaint Chief Complaint  Patient presents with   URI    Have no idea what URI is. I have another UTI - Entered by patient   Appointment    0930    HPI Kristi Grant is a 88 y.o. female.   Patient presents for 1 day of burning with urination, increased urinary frequency and urgency, voiding small amounts, suprapubic pressure, and lower abdominal pain.  Reports no new urinary incontinence, hematuria, new back pain, flank pain, fever, bodies, chills, nausea/vomiting, or decreased appetite.  No vaginal discharge.  Reports that she has taken 1 dose of Azo which helped with her symptoms completely.  Reports 2 recent UTIs that she thought her symptoms fully improved from each time.  She was seen in urgent care on 10/01/2022, diagnosed with a UTI, and treated with fosfomycin 1 dose.  Urine culture on 10/01/2022 showed no resistant strains.  Reports her symptoms fully improved, then recurred on 10/13/2022.  She was again diagnosed with a UTI, treated with Bactrim twice daily for 3 days.  Urine culture at that time showed resistance to ampicillin, nitrofurantoin.  Reports her symptoms fully improved at that time as well.  Patient's treatment plan is complicated by multiple antibiotic allergies including clindamycin, ciprofloxacin, Flagyl, and ceftriaxone.  Reports that she had hives with each of these antibiotics.  Patient reports he has a follow-up with her primary care provider scheduled for Monday to discuss her blood pressure.  Reports it is normally 130s over 70s at home.  She does not have any chest pain, shortness of breath, dizziness/lightheadedness.  Reports in the summertime, she was taking amlodipine 2.5 mg and her blood pressure dropped to 90/50 and she felt "terrible."  Blood pressure today is significantly elevated, however upon recheck, systolic improved by 20 points.    Past  Medical History:  Diagnosis Date   Arthritis    Blood transfusion without reported diagnosis    Cataract    Hypertension    Neuromuscular disorder [facility])     Patient Active Problem List   Diagnosis Date Noted   Gout 01/24/2016   Osteopenia 01/24/2016   History of left knee replacement 06/28/2015   Essential hypertension, benign 09/01/2013   Hyperlipemia 09/01/2013   UTI (urinary tract infection) 06/28/2013    Past Surgical History:  Procedure Laterality Date   ABDOMINAL HYSTERECTOMY     BASAL CELL CARCINOMA EXCISION  01/2011   nose   CATARACT EXTRACTION     Ovarian wedge resection  1962   REPLACEMENT TOTAL KNEE Left 05/2010   VAGINAL HYSTERECTOMY  1990    OB History   No obstetric history on file.      Home Medications    Prior to Admission medications   Medication Sig Start Date End Date Taking? Authorizing Provider  fosfomycin (MONUROL) 3 g PACK Take 3 g by mouth every other day for 3 doses. 11/02/22 11/07/22 Yes Valentino Nose, NP  phenazopyridine (PYRIDIUM) 95 MG tablet Take 95 mg by mouth 3 (three) times daily as needed for pain.   Yes [provider]  allopurinol (ZYLOPRIM) 100 MG tablet Take 1 tablet (100 mg total) by mouth daily. 06/05/22   [employer]med[employer], Alvino Chapel, NP  amLODipine (NORVASC) 2.5 MG tablet TAKE ONE TABLET BY MOUTH ONCE DAILY. 02/28/22   Babs Sciara, MD  cholecalciferol (VITAMIN D3) 25 MCG (1000  UNIT) tablet Take 1,000 Units by mouth daily.    [provider]  Coenzyme Q10 (COQ-10) 100 MG CAPS Take by mouth daily.    [provider]  Methylcobalamin (B12-ACTIVE PO) Take by mouth.    [provider]  OVER THE COUNTER MEDICATION Vit d daily    [provider]  pravastatin (PRAVACHOL) 20 MG tablet TAKE ONE TABLET BY MOUTH ONCE DAILY. 09/18/22   Babs Sciara, MD    Family History Family History  Problem Relation Age of Onset   Heart disease Mother    Heart disease Father    Breast cancer Sister     Cirrhosis Sister    Diabetes Sister    Breast cancer Maternal Grandmother    Ovarian cancer Maternal Grandmother     Social History Social History   Tobacco Use   Smoking status: Never   Smokeless tobacco: Never  Substance Use Topics   Alcohol use: No   Drug use: No     Allergies   Clindamycin/lincomycin, Ciprofloxacin, Flagyl [metronidazole], Hydrocodone, Iohexol, Nucynta [tapentadol], and Rocephin [ceftriaxone sodium in dextrose]   Review of Systems Review of Systems Per HPI  Physical Exam Triage Vital Signs ED Triage Vitals [11/02/22 0926]  Enc Vitals Group     BP (!) 192/92     Pulse Rate 79     Resp 17     Temp 99.4 F (37.4 C)     Temp Source Oral     SpO2 94 %     Weight      Height      Head Circumference      Peak Flow      Pain Score 0     Pain Loc      Pain Edu?      Excl. in [place]?    No data found.  Updated Vital Signs BP (!) 171/86 (BP Location: Right Arm)   Pulse 79   Temp 99.4 F (37.4 C) (Oral)   Resp 17   SpO2 94%   Visual Acuity Right Eye Distance:   Left Eye Distance:   Bilateral Distance:    Right Eye Near:   Left Eye Near:    Bilateral Near:     Physical Exam Vitals and nursing note reviewed.  Constitutional:      General: She is not in acute distress.    Appearance: She is not toxic-appearing.  HENT:     Mouth/Throat:     Mouth: Mucous membranes are moist.     Pharynx: Oropharynx is clear.  Cardiovascular:     Rate and Rhythm: Normal rate.  Pulmonary:     Effort: Pulmonary effort is normal. No respiratory distress.  Abdominal:     General: Abdomen is flat. Bowel sounds are normal. There is no distension.     Palpations: Abdomen is soft. There is no mass.     Tenderness: There is no abdominal tenderness. There is no right CVA tenderness, left CVA tenderness or guarding.  Skin:    General: Skin is warm and dry.     Coloration: Skin is not jaundiced or pale.     Findings: No erythema.  Neurological:     Mental  Status: She is alert and oriented to person, place, and time.     Motor: No weakness.     Gait: Gait normal.  Psychiatric:        Behavior: Behavior is cooperative.      UC Treatments / Results  Labs (  all labs ordered are listed, but only abnormal results are displayed) Labs Reviewed  POCT URINALYSIS DIP (MANUAL ENTRY) - Abnormal; Notable for the following components:      Result Value   Color, UA orange (*)    Clarity, UA cloudy (*)    Glucose, UA =100 (*)    Bilirubin, UA small (*)    Ketones, POC UA trace (5) (*)    Blood, UA large (*)    Protein Ur, POC >=300 (*)    Nitrite, UA Positive (*)    Leukocytes, UA Moderate (2+) (*)    All other components within normal limits  URINE CULTURE    EKG   Radiology No results found.  Procedures Procedures (including critical care time)  Medications Ordered in UC Medications - No data to display  Initial Impression / Assessment and Plan / UC Course  I have reviewed the triage vital signs and the nursing notes.  Pertinent labs & imaging results that were available during my care of the patient were reviewed by me and considered in my medical decision making (see chart for details).   Patient is well-appearing, afebrile, not tachycardic, not tachypneic, oxygenating well on room air.  She is hypertensive today, likely secondary to acute illness in urgent care setting.  Patient has follow-up scheduled for elevated blood pressure with PCP on Monday.  Complicated UTI (urinary tract infection) Treat with fosfomycin 3 g every 48 hours x 3 doses Urine culture pending Supportive care discussed ER and return precautions discussed Recommended follow-up with urology given 3 UTIs in the past 30 days-recommended talking with her PCP at next visit  The patient was given the opportunity to ask questions.  All questions answered to their satisfaction.  The patient is in agreement to this plan.  Final Clinical Impressions(s) / UC Diagnoses    Final diagnoses:  Complicated UTI (urinary tract infection)     Discharge Instructions      You most likely have another UTI.  This is the 3rd UTI in about 1 month.  We are treating you with fosfomycin 3g every 48 hours x 3 doses.  The urine culture is pending and we will call you in a couple of days if we need to switch antibiotics.   Please follow up with Dr. Gerda Diss as scheduled.  I also recommend scheduling a visit with a Urologist for further evaluation of recurrent UTI.  Contact information is attached.     ED Prescriptions     Medication Sig Dispense Auth. Provider   fosfomycin (MONUROL) 3 g PACK Take 3 g by mouth every other day for 3 doses. 9 g Valentino Nose, NP      PDMP not reviewed this encounter.   Valentino Nose, NP 11/02/22 1101

## 2022-11-02 NOTE — ED Triage Notes (Signed)
Pt reports burning when urinating x 1 day. AZO relief bladder spasm.

## 2022-11-02 NOTE — Discharge Instructions (Signed)
You most likely have another UTI.  This is the 3rd UTI in about 1 month.  We are treating you with fosfomycin 3g every 48 hours x 3 doses.  The urine culture is pending and we will call you in a couple of days if we need to switch antibiotics.   Please follow up with Dr. Gerda Diss as scheduled.  I also recommend scheduling a visit with a Urologist for further evaluation of recurrent UTI.  Contact information is attached.

## 2022-11-05 ENCOUNTER — Telehealth: Payer: Self-pay

## 2022-11-05 ENCOUNTER — Ambulatory Visit: Payer: Medicare PPO | Admitting: Family Medicine

## 2022-11-05 VITALS — BP 138/78 | HR 80 | Temp 97.7°F | Ht 68.0 in | Wt 173.0 lb

## 2022-11-05 DIAGNOSIS — R0609 Other forms of dyspnea: Secondary | ICD-10-CM

## 2022-11-05 DIAGNOSIS — I10 Essential (primary) hypertension: Secondary | ICD-10-CM

## 2022-11-05 DIAGNOSIS — N390 Urinary tract infection, site not specified: Secondary | ICD-10-CM | POA: Diagnosis not present

## 2022-11-05 DIAGNOSIS — I5189 Other ill-defined heart diseases: Secondary | ICD-10-CM | POA: Diagnosis not present

## 2022-11-05 LAB — URINE CULTURE: Culture: 30000 — AB

## 2022-11-05 NOTE — Telephone Encounter (Signed)
Patient will be dropping off urine samples in he next couple of weeks per Dr Gerda Diss , send orders for UA and culture when specimens are dropped off.

## 2022-11-05 NOTE — Progress Notes (Signed)
   Subjective:    Patient ID: Kristi Grant, female    DOB: [birth year], 88 y.o.   MRN: [id number]  Hypertension This is a chronic problem. Treatments tried: no current medications.   Patient c/o frequent urination currently on treatment , fosfomycin by UC Had 3 UTI in 5 weeks  Essential hypertension, benign  Frequent UTI  Diastolic dysfunction  DOE (dyspnea on exertion)  Recent echo showed possible LVH she will be seeing cardiology in the near future  Review of Systems     Objective:   Physical Exam  General-in no acute distress Eyes-no discharge Lungs-respiratory rate normal, CTA CV-no murmurs,RRR Extremities skin warm dry no edema Neuro grossly normal Behavior normal, alert       Assessment & Plan:   1. Essential hypertension, benign Her blood pressure tends to bounce around at times elevated other times not elevated and when we have tried low-dose blood pressure medicines her pressures at times have gone significantly low  2. Frequent UTI Go ahead with finishing the antibiotic.  UA and urine culture in the near future, adequate fluid intake and double voiding was discussed  3. Diastolic dysfunction Abnormal echo Will be seeing cardiology near future near We will wait to see what they prescribe   4. DOE (dyspnea on exertion) This occurs more so when she is exerting herself she does have some diastolic dysfunction we will wait to find out what cardiology has to say patient will follow-up with [place] in early January

## 2022-11-13 ENCOUNTER — Telehealth: Payer: Self-pay

## 2022-11-13 ENCOUNTER — Other Ambulatory Visit (INDEPENDENT_AMBULATORY_CARE_PROVIDER_SITE_OTHER): Payer: Medicare PPO

## 2022-11-13 ENCOUNTER — Other Ambulatory Visit: Payer: Self-pay

## 2022-11-13 DIAGNOSIS — N390 Urinary tract infection, site not specified: Secondary | ICD-10-CM

## 2022-11-13 LAB — POCT URINALYSIS DIP (CLINITEK)
Bilirubin, UA: NEGATIVE
Glucose, UA: NEGATIVE mg/dL
Ketones, POC UA: NEGATIVE mg/dL
Leukocytes, UA: NEGATIVE
Nitrite, UA: NEGATIVE
POC PROTEIN,UA: NEGATIVE
Spec Grav, UA: 1.01 (ref 1.010–1.025)
Urobilinogen, UA: 0.2 E.U./dL
pH, UA: 7.5 (ref 5.0–8.0)

## 2022-11-13 NOTE — Telephone Encounter (Signed)
Caller name: [name]  On DPR?: Yes  Call back number: [phone] (mobile)  Provider they see: Babs Sciara, MD  Reason for call:Per Dr Lorin Picket urine sample dropped off left at nurses station

## 2022-11-13 NOTE — Telephone Encounter (Signed)
Poc urinalysis resulted and urine culture sent to lab.

## 2022-11-14 NOTE — Telephone Encounter (Signed)
Please disregard previous result message regarding urine.  It is apparent that the urine was sent for culture which I am glad thank you

## 2022-11-19 LAB — URINE CULTURE

## 2022-11-19 LAB — SPECIMEN STATUS REPORT

## 2022-11-19 MED ORDER — AMOXICILLIN-POT CLAVULANATE 875-125 MG PO TABS: 1.0000 | ORAL_TABLET | Freq: Two times a day (BID) | ORAL | 0 refills | Status: DC

## 2022-11-19 NOTE — Addendum Note (Signed)
Addended by: Margaretha Sheffield on: 11/19/2022 02:06 PM   Modules accepted: Orders

## 2022-12-04 ENCOUNTER — Telehealth (INDEPENDENT_AMBULATORY_CARE_PROVIDER_SITE_OTHER): Payer: Medicare PPO

## 2022-12-04 DIAGNOSIS — N390 Urinary tract infection, site not specified: Secondary | ICD-10-CM | POA: Diagnosis not present

## 2022-12-04 LAB — POCT URINALYSIS DIP (CLINITEK)
Spec Grav, UA: 1.01 (ref 1.010–1.025)
pH, UA: 6 (ref 5.0–8.0)

## 2022-12-04 NOTE — Telephone Encounter (Signed)
Pt contacted and aware that culture was sent. Pt verbalized understanding.

## 2022-12-04 NOTE — Telephone Encounter (Signed)
Caller name: [name]  On DPR?: Yes  Call back number: [phone] (mobile)  Provider they see: Babs Sciara, MD  Reason for call:Urine sample dropped off left a nurse station

## 2022-12-04 NOTE — Telephone Encounter (Signed)
Await culture results thank you 

## 2022-12-04 NOTE — Telephone Encounter (Signed)
Culture dipped and sent for culture per result note. Please advise. Thank you  Results for orders placed or performed in visit on 12/04/22  POCT URINALYSIS DIP (CLINITEK)  Result Value Ref Range   Color, UA     Clarity, UA     Glucose, UA     Bilirubin, UA     Ketones, POC UA     Spec Grav, UA 1.010 1.010 - 1.025   Blood, UA small (A) negative   pH, UA 6.0 5.0 - 8.0   POC PROTEIN,UA     Urobilinogen, UA     Nitrite, UA     Leukocytes, UA

## 2022-12-06 ENCOUNTER — Encounter: Payer: Self-pay | Admitting: Cardiology

## 2022-12-06 ENCOUNTER — Ambulatory Visit: Payer: Medicare PPO | Attending: Cardiology | Admitting: Cardiology

## 2022-12-06 VITALS — BP 190/90 | HR 84 | Ht 67.0 in | Wt 171.0 lb

## 2022-12-06 DIAGNOSIS — I10 Essential (primary) hypertension: Secondary | ICD-10-CM | POA: Diagnosis not present

## 2022-12-06 DIAGNOSIS — R0609 Other forms of dyspnea: Secondary | ICD-10-CM

## 2022-12-06 NOTE — Progress Notes (Signed)
Clinical Summary Kristi Grant is a 88 y.o.female seen today as a new consult, referred by Dr Gerda Diss for dyspnea  Dyspnea - 08/2022 echo: LVEF 60-65%, grade I dd, normal RV - notes recent DOE, particularly with walking up inclines.  - started 4-5 months. No chest pains. No recent edema. No orthopnea  - no coughing or wheezing - no significant smoking history.  - BNP 63 back in Sept    2.HTN -history of white coat HTN - reports prior issues with low bp's, off bp meds. Had been on very low dose norvasc      Past Medical History:  Diagnosis Date   Arthritis    Blood transfusion without reported diagnosis    Cataract    Hypertension    Neuromuscular disorder (HCC)      Allergies  Allergen Reactions   Clindamycin/Lincomycin Swelling   Ciprofloxacin Rash    Hives 5 days after   Flagyl [Metronidazole]     Hives 5 days after   Hydrocodone     hallucinations   Iohexol      Code: HIVES, Desc: Pt. given 53ml Multihance for MRI scan of liver.  Pt. presented with multiple hives immediately post exam.  Pt. was seen by Radiologist who proscribed 50mg  PO Benadryl.  Pt. observed post Benadryl for 30 min. before Rad. released her., Onset Date:    Nucynta [Tapentadol]     hallucinations   Rocephin [Ceftriaxone Sodium In Dextrose] Hives    Delayed same day-can take amoxil     Current Outpatient Medications  Medication Sig Dispense Refill   allopurinol (ZYLOPRIM) 100 MG tablet Take 1 tablet (100 mg total) by mouth daily. 90 tablet 1   cholecalciferol (VITAMIN D3) 25 MCG (1000 UNIT) tablet Take 1,000 Units by mouth daily.     Coenzyme Q10 (COQ-10) 100 MG CAPS Take by mouth daily.     Methylcobalamin (B12-ACTIVE PO) Take by mouth.     pravastatin (PRAVACHOL) 20 MG tablet TAKE ONE TABLET BY MOUTH ONCE DAILY. 90 tablet 0   No current facility-administered medications for this visit.     Past Surgical History:  Procedure Laterality Date   ABDOMINAL HYSTERECTOMY      BASAL CELL CARCINOMA EXCISION  01/2011   nose   CATARACT EXTRACTION     Ovarian wedge resection  1962   REPLACEMENT TOTAL KNEE Left 05/2010   VAGINAL HYSTERECTOMY  1990     Allergies  Allergen Reactions   Clindamycin/Lincomycin Swelling   Ciprofloxacin Rash    Hives 5 days after   Flagyl [Metronidazole]     Hives 5 days after   Hydrocodone     hallucinations   Iohexol      Code: HIVES, Desc: Pt. given 32ml Multihance for MRI scan of liver.  Pt. presented with multiple hives immediately post exam.  Pt. was seen by Radiologist who proscribed 50mg  PO Benadryl.  Pt. observed post Benadryl for 30 min. before Rad. released her., Onset Date: 18m    Nucynta [Tapentadol]     hallucinations   Rocephin [Ceftriaxone Sodium In Dextrose] Hives    Delayed same day-can take amoxil      Family History  Problem Relation Age of Onset   Heart disease Mother    Heart disease Father    Breast cancer Sister    Cirrhosis Sister    Diabetes Sister    Breast cancer Maternal Grandmother    Ovarian cancer Maternal Grandmother  Social History Kristi Grant reports that she has never smoked. She has never used smokeless tobacco. Kristi Grant reports no history of alcohol use.   Review of Systems CONSTITUTIONAL: No weight loss, fever, chills, weakness or fatigue.  HEENT: Eyes: No visual loss, blurred vision, double vision or yellow sclerae.No hearing loss, sneezing, congestion, runny nose or sore throat.  SKIN: No rash or itching.  CARDIOVASCULAR: per hpi RESPIRATORY: No shortness of breath, cough or sputum.  GASTROINTESTINAL: No anorexia, nausea, vomiting or diarrhea. No abdominal pain or blood.  GENITOURINARY: No burning on urination, no polyuria NEUROLOGICAL: No headache, dizziness, syncope, paralysis, ataxia, numbness or tingling in the extremities. No change in bowel or bladder control.  MUSCULOSKELETAL: No muscle, back pain, joint pain or stiffness.  LYMPHATICS: No enlarged nodes.  No history of splenectomy.  PSYCHIATRIC: No history of depression or anxiety.  ENDOCRINOLOGIC: No reports of sweating, cold or heat intolerance. No polyuria or polydipsia.  Kristi Grant   Physical Examination Today's Vitals   12/06/22 0855 12/06/22 0939  BP: (!) 218/100 (!) 190/90  Pulse: 84   SpO2: 98%   Weight: 171 lb (77.6 kg)   Height: 5\' 7"  (1.702 m)    Body mass index is 26.78 kg/m.  Gen: resting comfortably, no acute distress HEENT: no scleral icterus, pupils equal round and reactive, no palptable cervical adenopathy,  CV: RRR, no m/r/g, no jvd Resp: Clear to auscultation bilaterally GI: abdomen is soft, non-tender, non-distended, normal bowel sounds, no hepatosplenomegaly MSK: extremities are warm, no edema.  Skin: warm, no rash Neuro:  no focal deficits Psych: appropriate affect   Diagnostic Studies  08/2022 echo 1. Left ventricular ejection fraction, by estimation, is 60 to 65%. The  left ventricle has normal function. The left ventricle has no regional  wall motion abnormalities. There is moderate left ventricular hypertrophy.  Left ventricular diastolic  parameters are consistent with Grade I diastolic dysfunction (impaired  relaxation).   2. Right ventricular systolic function is normal. The right ventricular  size is normal. There is normal pulmonary artery systolic pressure.   3. The mitral valve is normal in structure. No evidence of mitral valve  regurgitation. No evidence of mitral stenosis.   4. The tricuspid valve is abnormal.   5. The aortic valve is tricuspid. There is mild calcification of the  aortic valve. There is mild thickening of the aortic valve. Aortic valve  regurgitation is not visualized. No aortic stenosis is present.   6. The inferior vena cava is dilated in size with >50% respiratory  variability, suggesting right atrial pressure of 8 mmHg.    Assessment and Plan  1.DOE - unclear etiology - fairly benign echo other than mild diastolic  dysfunction. She is euvolemic by exam, prior BNP was normal - plan for GXT to objectively assess functional capacity, evaluate for ischemia. Given high bp's would need to hold on GXT however, she will call and udpate 09/2022 on home bp's  2. HTN - previously on norvasc, issues with low bp's on very low doses and was discontinued - she reports some issues with white coat HTN in the past - she will update [place] on home bp's on Monday. High threshold to reconsider bp meds but certaintly if 190s/200s at home would need to consider. At last pcp visit was 130s/80s   F/u pending. She is to call with home bp's Monday, if reasonable would plan for GXT.    Sunday, M.D.

## 2022-12-06 NOTE — Patient Instructions (Signed)
Medication Instructions:  Your physician recommends that you continue on your current medications as directed. Please refer to the Current Medication list given to you today.   Labwork: None  Testing/Procedures: None  Follow-Up: Follow up with Dr. Wyline Mood- Pending  Any Other Special Instructions Will Be Listed Below (If Applicable).  Your physician has requested that you regularly monitor and record your blood pressure readings at home. Please use the same machine at the same time of day to check your readings and record them to bring to your follow-up visit.  Call our office MONDAY with bp readings. This will determine next steps.    If you need a refill on your cardiac medications before your next appointment, please call your pharmacy.

## 2022-12-07 LAB — URINE CULTURE

## 2022-12-08 ENCOUNTER — Encounter: Payer: Self-pay | Admitting: Family Medicine

## 2022-12-10 ENCOUNTER — Ambulatory Visit: Payer: Medicare PPO | Admitting: Family Medicine

## 2022-12-10 ENCOUNTER — Telehealth: Payer: Self-pay | Admitting: Cardiology

## 2022-12-10 NOTE — Telephone Encounter (Signed)
  Pt said, Dr. Gerda Diss saw her BP readings and has f/u with him on 01/09/23. She said, she will f/u with dr. Gerda Diss first for her blood pressure and will get in touch with Dr. Wyline Mood if there's any concerns.

## 2022-12-10 NOTE — Telephone Encounter (Signed)
Noted, patient will have pcp monitor her BP's . I will FYI Dr. Wyline Mood

## 2023-01-09 ENCOUNTER — Ambulatory Visit: Payer: Medicare PPO | Admitting: Family Medicine

## 2023-01-09 VITALS — BP 190/92 | HR 76 | Temp 97.3°F | Ht 67.0 in | Wt 171.6 lb

## 2023-01-09 DIAGNOSIS — I10 Essential (primary) hypertension: Secondary | ICD-10-CM | POA: Diagnosis not present

## 2023-01-09 MED ORDER — AMLODIPINE BESYLATE 2.5 MG PO TABS: 2.5000 mg | ORAL_TABLET | Freq: Every day | ORAL | 3 refills | Status: DC

## 2023-01-09 NOTE — Progress Notes (Signed)
   Subjective:    Patient ID: Kristi Grant, female    DOB: 26-Jul-1934, 89 y.o.   MRN: [id number]  HPI Patient arrives today for follow up on blood pressures and discuss visit to cardiologist.  Previously the patient had low blood pressure spells while on medication that precluded the use of any significant levels of blood pressure medicine but now that she has been off the medicine for multiple months her blood pressure is severely elevated but she denies any chest tightness pressure pain or headaches.  Denies sweats chills fevers. Energy level is subpar she finds herself out of breath with activity.  Review of Systems     Objective:   Physical Exam General-in no acute distress Eyes-no discharge Lungs-respiratory rate normal, CTA CV-no murmurs,RRR Extremities skin warm dry no edema Neuro grossly normal Behavior normal, alert        Assessment & Plan:  HTN poor control At risk of this causing stroke or heart attack but I believe if we get going on medicine we can bring this down to a more safer level No need to hospitalize currently Labs previous notes from cardiology reviewed Blood pressure unacceptable Restart amlodipine 2.5 mg daily Recheck blood pressure within 7 to 10 days Bring blood pressure cuff with her when she comes Continue healthy diet minimizing salt More than likely will have to keep adjusting medicines to get top number closer to 140 bottom number closer to 80 given her age we are not trying to get to 130/70

## 2023-01-18 ENCOUNTER — Ambulatory Visit: Payer: Medicare PPO | Admitting: Family Medicine

## 2023-01-18 VITALS — BP 130/78 | HR 80 | Temp 98.2°F | Ht 67.0 in | Wt 170.6 lb

## 2023-01-18 DIAGNOSIS — I10 Essential (primary) hypertension: Secondary | ICD-10-CM | POA: Diagnosis not present

## 2023-01-18 NOTE — Progress Notes (Unsigned)
   Subjective:    Patient ID: Kristi Grant, female    DOB: [birth year], 89 y.o.   MRN: [id number]  HPI Patient arrives today follow up for hypertension medication. She is tolerating medicine well.  Blood pressure readings most of them look good somewhat high one of them is low she denies any dizziness or passing out Patient states no concerns or issues today.   Review of Systems     Objective:   Physical Exam General-in no acute distress Eyes-no discharge Lungs-respiratory rate normal, CTA CV-no murmurs,RRR Extremities skin warm dry no edema Neuro grossly normal Behavior normal, alert        Assessment & Plan:  HTN-good control currently.  Continue current medicine and send [place] readings periodically follow-up end of April lab work before that visit  We discussed the medication I believe it is safe for her to continue the medicine.  We will not do any other measures currently.  We will geared towards seeing her in her follow-up in late April with lab work before that visit.  Should she have any low blood pressures before then she will notify us.  Her blood pressure cuff is calibrated and is seems to be reasonable

## 2023-01-18 NOTE — Patient Instructions (Signed)
Please send an update periodically regarding your blood pressures  Please do your lab work and urine test before your follow-up visit in late April  Should you have any troubles let me know  TakeCare-Dr. Nicki Reaper

## 2023-03-08 ENCOUNTER — Ambulatory Visit
Admission: RE | Admit: 2023-03-08 | Discharge: 2023-03-08 | Disposition: A | Discharge status: Home / Self Care | Payer: Medicare PPO | Source: Ambulatory Visit | Attending: Family Medicine | Admitting: Family Medicine

## 2023-03-08 VITALS — BP 160/84 | HR 87 | Temp 98.3°F | Resp 18

## 2023-03-08 DIAGNOSIS — J029 Acute pharyngitis, unspecified: Secondary | ICD-10-CM | POA: Diagnosis present

## 2023-03-08 DIAGNOSIS — J069 Acute upper respiratory infection, unspecified: Secondary | ICD-10-CM | POA: Insufficient documentation

## 2023-03-08 DIAGNOSIS — Z1152 Encounter for screening for COVID-19: Secondary | ICD-10-CM | POA: Insufficient documentation

## 2023-03-08 LAB — POCT INFLUENZA A/B
Influenza A, POC: NEGATIVE
Influenza B, POC: NEGATIVE

## 2023-03-08 MED ORDER — FLUTICASONE PROPIONATE 50 MCG/ACT NA SUSP: 1.0000 | Freq: Two times a day (BID) | NASAL | 2 refills | Status: DC

## 2023-03-08 MED ORDER — LIDOCAINE VISCOUS HCL 2 % MT SOLN: 10.0000 mL | OROMUCOSAL | 0 refills | Status: DC | PRN

## 2023-03-08 NOTE — ED Triage Notes (Signed)
Pt reports cough and sore throat x 1 day.

## 2023-03-08 NOTE — Discharge Instructions (Signed)
Your flu test today was negative, your COVID test should be back tomorrow.  I have sent over a steroid nasal spray to help with the runny nose and a numbing gargle to help with your sore throat.  You may also take Coricidin HBP, plain Mucinex, Delsym, sinus rinses and humidifiers.  Follow-up for significantly worsening symptoms.

## 2023-03-08 NOTE — ED Provider Notes (Signed)
[facility]    CSN: [id number] Arrival date & time: 03/08/23  1213      History   Chief Complaint Chief Complaint  Patient presents with   Sore Throat    Entered by patient    Appointment    1230    HPI Kristi Grant is a 89 y.o. female.   Presenting today with 1 day history of cough, scratchy throat, mild runny nose.  Denies fever, chills, chest pain, shortness of breath, abdominal pain, nausea vomiting or diarrhea.  So far not trying anything over-the-counter for symptoms.  No known pertinent chronic medical problems or sick contacts.    Past Medical History:  Diagnosis Date   Arthritis    Blood transfusion without reported diagnosis    Cataract    Hypertension    Neuromuscular disorder [facility])     Patient Active Problem List   Diagnosis Date Noted   Gout 01/24/2016   Osteopenia 01/24/2016   History of left knee replacement 06/28/2015   Essential hypertension, benign 09/01/2013   Hyperlipemia 09/01/2013   UTI (urinary tract infection) 06/28/2013    Past Surgical History:  Procedure Laterality Date   ABDOMINAL HYSTERECTOMY     BASAL CELL CARCINOMA EXCISION  01/2011   nose   CATARACT EXTRACTION     Ovarian wedge resection  1962   REPLACEMENT TOTAL KNEE Left 05/2010   VAGINAL HYSTERECTOMY  1990    OB History   No obstetric history on file.      Home Medications    Prior to Admission medications   Medication Sig Start Date End Date Taking? Authorizing Provider  fluticasone (FLONASE) 50 MCG/ACT nasal spray Place 1 spray into both nostrils 2 (two) times daily. 03/08/23  Yes Volney American, PA-C  lidocaine (XYLOCAINE) 2 % solution Use as directed 10 mLs in the mouth or throat every 3 (three) hours as needed for mouth pain. 03/08/23  Yes Volney American, PA-C  allopurinol (ZYLOPRIM) 100 MG tablet Take 1 tablet (100 mg total) by mouth daily. 06/05/22   [employer], Trenton Gammon, FNP  amLODipine (NORVASC) 2.5 MG tablet Take 1 tablet (2.5 mg  total) by mouth daily. 01/09/23   Kathyrn Drown, MD  cholecalciferol (VITAMIN D3) 25 MCG (1000 UNIT) tablet Take 1,000 Units by mouth daily.    [provider]  Coenzyme Q10 (COQ-10) 100 MG CAPS Take by mouth daily.    [provider]  Methylcobalamin (B12-ACTIVE PO) Take by mouth.    [provider]  pravastatin (PRAVACHOL) 20 MG tablet TAKE ONE TABLET BY MOUTH ONCE DAILY. 09/18/22   Kathyrn Drown, MD    Family History Family History  Problem Relation Age of Onset   Heart disease Mother    Heart disease Father    Breast cancer Sister    Cirrhosis Sister    Diabetes Sister    Breast cancer Maternal Grandmother    Ovarian cancer Maternal Grandmother     Social History Social History   Tobacco Use   Smoking status: Never   Smokeless tobacco: Never  Vaping Use   Vaping Use: Never used  Substance Use Topics   Alcohol use: No   Drug use: No     Allergies   Clindamycin/lincomycin, Ciprofloxacin, Flagyl [metronidazole], Hydrocodone, Iohexol, Nucynta [tapentadol], and Rocephin [ceftriaxone sodium in dextrose]   Review of Systems Review of Systems PER HPI  Physical Exam Triage Vital Signs ED Triage Vitals  Enc Vitals Group     BP  03/08/23 1259 (!) 160/84     Pulse Rate 03/08/23 1259 87     Resp 03/08/23 1259 18     Temp 03/08/23 1259 98.3 F (36.8 C)     Temp Source 03/08/23 1259 Oral     SpO2 03/08/23 1259 94 %     Weight --      Height --      Head Circumference --      Peak Flow --      Pain Score 03/08/23 1302 6     Pain Loc --      Pain Edu? --      Excl. in [place]? --    No data found.  Updated Vital Signs BP (!) 160/84 (BP Location: Right Arm)   Pulse 87   Temp 98.3 F (36.8 C) (Oral)   Resp 18   SpO2 94%   Visual Acuity Right Eye Distance:   Left Eye Distance:   Bilateral Distance:    Right Eye Near:   Left Eye Near:    Bilateral Near:     Physical Exam Vitals and nursing note reviewed.  Constitutional:       Appearance: Normal appearance.  HENT:     Head: Atraumatic.     Right Ear: Tympanic membrane and external ear normal.     Left Ear: Tympanic membrane and external ear normal.     Nose: Rhinorrhea present.     Mouth/Throat:     Mouth: Mucous membranes are moist.     Pharynx: Posterior oropharyngeal erythema present.  Eyes:     Extraocular Movements: Extraocular movements intact.     Conjunctiva/sclera: Conjunctivae normal.  Cardiovascular:     Rate and Rhythm: Normal rate and regular rhythm.     Heart sounds: Normal heart sounds.  Pulmonary:     Effort: Pulmonary effort is normal.     Breath sounds: Normal breath sounds. No wheezing or rales.  Musculoskeletal:        General: Normal range of motion.     Cervical back: Normal range of motion and neck supple.  Skin:    General: Skin is warm and dry.  Neurological:     Mental Status: She is alert and oriented to person, place, and time.  Psychiatric:        Mood and Affect: Mood normal.        Thought Content: Thought content normal.      UC Treatments / Results  Labs (all labs ordered are listed, but only abnormal results are displayed) Labs Reviewed  SARS CORONAVIRUS 2 (TAT 6-24 HRS)  POCT INFLUENZA A/B    EKG   Radiology No results found.  Procedures Procedures (including critical care time)  Medications Ordered in UC Medications - No data to display  Initial Impression / Assessment and Plan / UC Course  I have reviewed the triage vital signs and the nursing notes.  Pertinent labs & imaging results that were available during my care of the patient were reviewed by me and considered in my medical decision making (see chart for details).     Overall vitals and exam reassuring and suggestive of a viral respiratory infection.  Rapid flu negative, COVID testing pending.  Treat with supportive over-the-counter medications, home care, viscous lidocaine and good candidate for molnupiravir if positive for  COVID.  Final Clinical Impressions(s) / UC Diagnoses   Final diagnoses:  Viral URI     Discharge Instructions      Your flu test today  was negative, your COVID test should be back tomorrow.  I have sent over a steroid nasal spray to help with the runny nose and a numbing gargle to help with your sore throat.  You may also take Coricidin HBP, plain Mucinex, Delsym, sinus rinses and humidifiers.  Follow-up for significantly worsening symptoms.    ED Prescriptions     Medication Sig Dispense Auth. Provider   fluticasone (FLONASE) 50 MCG/ACT nasal spray Place 1 spray into both nostrils 2 (two) times daily. 16 g Volney American, PA-C   lidocaine (XYLOCAINE) 2 % solution Use as directed 10 mLs in the mouth or throat every 3 (three) hours as needed for mouth pain. 100 mL Volney American, [place]      PDMP not reviewed this encounter.   Volney American, [place] 03/08/23 1409

## 2023-03-09 LAB — SARS CORONAVIRUS 2 (TAT 6-24 HRS): SARS Coronavirus 2: NEGATIVE

## 2023-04-08 ENCOUNTER — Other Ambulatory Visit: Payer: Self-pay | Admitting: Family Medicine

## 2023-04-09 DIAGNOSIS — H35413 Lattice degeneration of retina, bilateral: Secondary | ICD-10-CM | POA: Diagnosis not present

## 2023-04-09 DIAGNOSIS — H52203 Unspecified astigmatism, bilateral: Secondary | ICD-10-CM | POA: Diagnosis not present

## 2023-04-09 DIAGNOSIS — Z961 Presence of intraocular lens: Secondary | ICD-10-CM | POA: Diagnosis not present

## 2023-04-15 ENCOUNTER — Telehealth: Payer: Self-pay | Admitting: Family Medicine

## 2023-04-15 DIAGNOSIS — E7849 Other hyperlipidemia: Secondary | ICD-10-CM

## 2023-04-15 DIAGNOSIS — I10 Essential (primary) hypertension: Secondary | ICD-10-CM

## 2023-04-15 DIAGNOSIS — M109 Gout, unspecified: Secondary | ICD-10-CM

## 2023-04-15 DIAGNOSIS — N289 Disorder of kidney and ureter, unspecified: Secondary | ICD-10-CM

## 2023-04-15 NOTE — Telephone Encounter (Signed)
Patient has appointment on 4/26 and needing labs done.

## 2023-04-15 NOTE — Telephone Encounter (Signed)
Lipid, liver, metabolic 7, urine ACR, uric acid  Hyperlipidemia hypertension gout

## 2023-04-16 NOTE — Telephone Encounter (Signed)
Blood Work ordered in [employer]. Patient notified

## 2023-04-17 DIAGNOSIS — E7849 Other hyperlipidemia: Secondary | ICD-10-CM | POA: Diagnosis not present

## 2023-04-17 DIAGNOSIS — N289 Disorder of kidney and ureter, unspecified: Secondary | ICD-10-CM | POA: Diagnosis not present

## 2023-04-17 DIAGNOSIS — M109 Gout, unspecified: Secondary | ICD-10-CM | POA: Diagnosis not present

## 2023-04-17 DIAGNOSIS — I10 Essential (primary) hypertension: Secondary | ICD-10-CM | POA: Diagnosis not present

## 2023-04-18 LAB — LIPID PANEL
Chol/HDL Ratio: 3.1 ratio (ref 0.0–4.4)
Cholesterol, Total: 210 mg/dL — ABNORMAL HIGH (ref 100–199)
HDL: 68 mg/dL (ref >39)
LDL Chol Calc (NIH): 127 mg/dL — ABNORMAL HIGH (ref 0–99)
Triglycerides: 82 mg/dL (ref 0–149)
VLDL Cholesterol Cal: 15 mg/dL (ref 5–40)

## 2023-04-18 LAB — HEPATIC FUNCTION PANEL
ALT: 12 IU/L (ref 0–32)
AST: 20 IU/L (ref 0–40)
Albumin: 4.7 g/dL (ref 3.7–4.7)
Alkaline Phosphatase: 73 IU/L (ref 44–121)
Bilirubin Total: 0.4 mg/dL (ref 0.0–1.2)
Bilirubin, Direct: <0.1 mg/dL (ref 0.00–0.40)
Total Protein: 7.3 g/dL (ref 6.0–8.5)

## 2023-04-18 LAB — MICROALBUMIN / CREATININE URINE RATIO
Creatinine, Urine: 55.6 mg/dL
Microalb/Creat Ratio: 33 mg/g creat — ABNORMAL HIGH (ref 0–29)
Microalbumin, Urine: 18.2 ug/mL

## 2023-04-18 LAB — BASIC METABOLIC PANEL
BUN/Creatinine Ratio: 24 (ref 12–28)
BUN: 23 mg/dL (ref 8–27)
CO2: 19 mmol/L — ABNORMAL LOW (ref 20–29)
Calcium: 10.1 mg/dL (ref 8.7–10.3)
Chloride: 99 mmol/L (ref 96–106)
Creatinine, Ser: 0.94 mg/dL (ref 0.57–1.00)
Glucose: 91 mg/dL (ref 70–99)
Potassium: 4.7 mmol/L (ref 3.5–5.2)
Sodium: 138 mmol/L (ref 134–144)
eGFR: 59 mL/min/{1.73_m2} — ABNORMAL LOW (ref >59)

## 2023-04-18 LAB — URIC ACID: Uric Acid: 6.2 mg/dL (ref 3.1–7.9)

## 2023-04-19 ENCOUNTER — Encounter: Payer: Self-pay | Admitting: Family Medicine

## 2023-04-19 ENCOUNTER — Ambulatory Visit: Payer: Medicare PPO | Admitting: Family Medicine

## 2023-04-19 VITALS — BP 142/72 | HR 71 | Ht 67.0 in | Wt 171.6 lb

## 2023-04-19 DIAGNOSIS — E7849 Other hyperlipidemia: Secondary | ICD-10-CM

## 2023-04-19 DIAGNOSIS — I10 Essential (primary) hypertension: Secondary | ICD-10-CM

## 2023-04-19 MED ORDER — PRAVASTATIN SODIUM 20 MG PO TABS: 20.0000 mg | ORAL_TABLET | Freq: Every day | ORAL | 1 refills | Status: DC

## 2023-04-19 MED ORDER — AMLODIPINE BESYLATE 2.5 MG PO TABS: 2.5000 mg | ORAL_TABLET | Freq: Every day | ORAL | 3 refills | Status: DC

## 2023-04-19 MED ORDER — ALLOPURINOL 100 MG PO TABS: 100.0000 mg | ORAL_TABLET | Freq: Every day | ORAL | 1 refills | Status: DC

## 2023-04-19 NOTE — Progress Notes (Unsigned)
Subjective:    Patient ID: Kristi Grant, female    DOB: 1934-04-03, 89 y.o.   MRN: [id number]  HPI Patient arrives today 3 month HTN.  Patient has ongoing history of hypertension She is on medication But should be noted that at times her blood pressure is quite low later in the day but other times it is moderately elevated but it is never in a critical range she denies any chest pressure tightness pain or shortness of breath She does state that if she works outside she does get sometimes fatigued tired or feeling like she is losing all her color so she tries to pace herself accordingly she does do regular walking she has not had any falls or passing out denies any other setbacks  Outpatient Encounter Medications as of 04/19/2023  Medication Sig   cholecalciferol (VITAMIN D3) 25 MCG (1000 UNIT) tablet Take 1,000 Units by mouth daily.   Coenzyme Q10 (COQ-10) 100 MG CAPS Take by mouth daily.   fluticasone (FLONASE) 50 MCG/ACT nasal spray Place 1 spray into both nostrils 2 (two) times daily.   lidocaine (XYLOCAINE) 2 % solution Use as directed 10 mLs in the mouth or throat every 3 (three) hours as needed for mouth pain.   Methylcobalamin (B12-ACTIVE PO) Take by mouth.   [DISCONTINUED] allopurinol (ZYLOPRIM) 100 MG tablet Take 1 tablet (100 mg total) by mouth daily.   [DISCONTINUED] amLODipine (NORVASC) 2.5 MG tablet Take 1 tablet (2.5 mg total) by mouth daily.   [DISCONTINUED] pravastatin (PRAVACHOL) 20 MG tablet TAKE ONE TABLET BY MOUTH ONCE DAILY.   allopurinol (ZYLOPRIM) 100 MG tablet Take 1 tablet (100 mg total) by mouth daily.   amLODipine (NORVASC) 2.5 MG tablet Take 1 tablet (2.5 mg total) by mouth daily.   pravastatin (PRAVACHOL) 20 MG tablet Take 1 tablet (20 mg total) by mouth daily.   No facility-administered encounter medications on file as of 04/19/2023.   Essential hypertension, benign - Plan: Basic Metabolic Panel (7), Microalbumin/Creatinine Ratio, Urine  Other  hyperlipidemia Results for orders placed or performed in visit on 04/15/23  Lipid panel  Result Value Ref Range   Cholesterol, Total 210 (H) 100 - 199 mg/dL   Triglycerides 82 0 - 149 mg/dL   HDL 68 >40 mg/dL   VLDL Cholesterol Cal 15 5 - 40 mg/dL   LDL Chol Calc (NIH) 981 (H) 0 - 99 mg/dL   Chol/HDL Ratio 3.1 0.0 - 4.4 ratio  Hepatic function panel  Result Value Ref Range   Total Protein 7.3 6.0 - 8.5 g/dL   Albumin 4.7 3.7 - 4.7 g/dL   Bilirubin Total 0.4 0.0 - 1.2 mg/dL   Bilirubin, Direct <1.91 0.00 - 0.40 mg/dL   Alkaline Phosphatase 73 44 - 121 IU/L   AST 20 0 - 40 IU/L   ALT 12 0 - 32 IU/L  Basic metabolic panel  Result Value Ref Range   Glucose 91 70 - 99 mg/dL   BUN 23 8 - 27 mg/dL   Creatinine, Ser 4.78 0.57 - 1.00 mg/dL   eGFR 59 (L) >29 FA/OZH/0.86   BUN/Creatinine Ratio 24 12 - 28   Sodium 138 134 - 144 mmol/L   Potassium 4.7 3.5 - 5.2 mmol/L   Chloride 99 96 - 106 mmol/L   CO2 19 (L) 20 - 29 mmol/L   Calcium 10.1 8.7 - 10.3 mg/dL  Microalbumin / creatinine urine ratio  Result Value Ref Range   Creatinine, Urine 55.6 Not Estab. mg/dL  Microalbumin, Urine 18.2 Not Estab. ug/mL   Microalb/Creat Ratio 33 (H) 0 - 29 mg/g creat  Uric Acid  Result Value Ref Range   Uric Acid 6.2 3.1 - 7.9 mg/dL   Reviewed over the lab work including the small amount of microalbuminuria but it should be noted that this is improved compared to several months ago  Review of Systems     Objective:   Physical Exam  General-in no acute distress Eyes-no discharge Lungs-respiratory rate normal, CTA CV-no murmurs,RRR Extremities skin warm dry no edema Neuro grossly normal Behavior normal, alert  Blood pressure acceptable for age     Assessment & Plan:  1. Essential hypertension, benign Check labs before the next visit Monitor protein in the urine If this starts going up may need to initiate ACE inhibitor Blood pressure under good control currently Healthy diet  recommended CKD 3a stable - Basic Metabolic Panel (7) - Microalbumin/Creatinine Ratio, Urine  2. Other hyperlipidemia Continue statin Follow-up by September  We did discuss the importance of monitoring kidney function and urine protein currently right now would not recommend adding additional medicines there is a possibility that we may shift over to ACE inhibitor or ARB especially if her numbers go up hold off on any [place]

## 2023-05-21 ENCOUNTER — Ambulatory Visit
Admission: RE | Admit: 2023-05-21 | Discharge: 2023-05-21 | Disposition: A | Discharge status: Home / Self Care | Payer: Medicare PPO | Source: Ambulatory Visit | Attending: Nurse Practitioner | Admitting: Nurse Practitioner

## 2023-05-21 ENCOUNTER — Other Ambulatory Visit: Payer: Self-pay

## 2023-05-21 VITALS — BP 173/82 | HR 81 | Temp 98.3°F | Resp 20

## 2023-05-21 DIAGNOSIS — R300 Dysuria: Secondary | ICD-10-CM | POA: Insufficient documentation

## 2023-05-21 LAB — POCT URINALYSIS DIP (MANUAL ENTRY)
Bilirubin, UA: NEGATIVE
Glucose, UA: 100 mg/dL — AB
Nitrite, UA: POSITIVE — AB
Protein Ur, POC: 30 mg/dL — AB
Spec Grav, UA: 1.015 (ref 1.010–1.025)
Urobilinogen, UA: 2 E.U./dL — AB
pH, UA: 6.5 (ref 5.0–8.0)

## 2023-05-21 MED ORDER — AMOXICILLIN-POT CLAVULANATE 875-125 MG PO TABS: 1.0000 | ORAL_TABLET | Freq: Two times a day (BID) | ORAL | 0 refills | Status: AC

## 2023-05-21 NOTE — ED Triage Notes (Addendum)
Pt reports dysuria, bladder spasms since Saturday.   Has tried AZO x2 and increased fluid intake and reports symptoms returned.  Reports history of similar in November and reports was able to take augmentin with no side effects.

## 2023-05-21 NOTE — Discharge Instructions (Addendum)
Urine culture is pending.  We will call you later this week if we need to change the antibiotic from the Augmentin.  Follow-up with your PCP if symptoms persist or worsen despite treatment or you can always follow-up here as well.

## 2023-05-21 NOTE — ED Provider Notes (Signed)
[facility]    CSN: [id number] Arrival date & time: 05/21/23  1043      History   Chief Complaint Chief Complaint  Patient presents with   Urinary Frequency    I have a UTI - Entered by patient    HPI Kristi Grant is a 89 y.o. female.   Patient presents today with 3-day history of dysuria, urinary frequency and urgency, foul urinary odor, lower abdominal/suprapubic pain/pressure, and bladder spasms.  She denies new urinary incontinence, hematuria, flank pain, fever, nausea/vomiting, and vaginal discharge.  She reports history of UTI, last time was in November/2023 and was treated with Augmentin which completely helped with symptoms.  Prior to that, she was treated with fosfomycin and Bactrim.  Patient has been taking Azo which helps with symptoms temporarily.  Patient does have a history of recurrent UTI.  Review review of chart shows in the past, urine culture has had resistance with ampicillin and nitrofurantoin.    Past Medical History:  Diagnosis Date   Arthritis    Blood transfusion without reported diagnosis    Cataract    Hypertension    Neuromuscular disorder [facility])     Patient Active Problem List   Diagnosis Date Noted   Gout 01/24/2016   Osteopenia 01/24/2016   History of left knee replacement 06/28/2015   Essential hypertension, benign 09/01/2013   Hyperlipemia 09/01/2013   UTI (urinary tract infection) 06/28/2013    Past Surgical History:  Procedure Laterality Date   ABDOMINAL HYSTERECTOMY     BASAL CELL CARCINOMA EXCISION  01/2011   nose   CATARACT EXTRACTION     Ovarian wedge resection  1962   REPLACEMENT TOTAL KNEE Left 05/2010   VAGINAL HYSTERECTOMY  1990    OB History   No obstetric history on file.      Home Medications    Prior to Admission medications   Medication Sig Start Date End Date Taking? Authorizing Provider  amoxicillin-clavulanate (AUGMENTIN) 875-125 MG tablet Take 1 tablet by mouth 2 (two) times daily for 5  days. 05/21/23 05/26/23 Yes Valentino Nose, NP  allopurinol (ZYLOPRIM) 100 MG tablet Take 1 tablet (100 mg total) by mouth daily. 04/19/23   Babs Sciara, MD  amLODipine (NORVASC) 2.5 MG tablet Take 1 tablet (2.5 mg total) by mouth daily. 04/19/23   Babs Sciara, MD  cholecalciferol (VITAMIN D3) 25 MCG (1000 UNIT) tablet Take 1,000 Units by mouth daily.    [provider]  Coenzyme Q10 (COQ-10) 100 MG CAPS Take by mouth daily.    [provider]  fluticasone (FLONASE) 50 MCG/ACT nasal spray Place 1 spray into both nostrils 2 (two) times daily. Patient not taking: Reported on 05/21/2023 03/08/23   Particia Nearing, PA-C  lidocaine (XYLOCAINE) 2 % solution Use as directed 10 mLs in the mouth or throat every 3 (three) hours as needed for mouth pain. Patient not taking: Reported on 05/21/2023 03/08/23   Particia Nearing, PA-C  Methylcobalamin (B12-ACTIVE PO) Take by mouth.    [provider]  pravastatin (PRAVACHOL) 20 MG tablet Take 1 tablet (20 mg total) by mouth daily. 04/19/23   Babs Sciara, MD    Family History Family History  Problem Relation Age of Onset   Heart disease Mother    Heart disease Father    Breast cancer Sister    Cirrhosis Sister    Diabetes Sister    Breast cancer Maternal Grandmother    Ovarian cancer Maternal Grandmother  Social History Social History   Tobacco Use   Smoking status: Never   Smokeless tobacco: Never  Vaping Use   Vaping Use: Never used  Substance Use Topics   Alcohol use: No   Drug use: No     Allergies   Clindamycin/lincomycin, Ciprofloxacin, Flagyl [metronidazole], Hydrocodone, Iohexol, Nucynta [tapentadol], and Rocephin [ceftriaxone sodium in dextrose]   Review of Systems Review of Systems Per HPI  Physical Exam Triage Vital Signs ED Triage Vitals  Enc Vitals Group     BP 05/21/23 1126 (!) 173/82     Pulse Rate 05/21/23 1126 81     Resp 05/21/23 1126 20     Temp 05/21/23 1126  98.3 F (36.8 C)     Temp Source 05/21/23 1126 Oral     SpO2 05/21/23 1126 93 %     Weight --      Height --      Head Circumference --      Peak Flow --      Pain Score 05/21/23 1128 1     Pain Loc --      Pain Edu? --      Excl. in GC? --    No data found.  Updated Vital Signs BP (!) 173/82 (BP Location: Right Arm)   Pulse 81   Temp 98.3 F (36.8 C) (Oral)   Resp 20   SpO2 93%   Visual Acuity Right Eye Distance:   Left Eye Distance:   Bilateral Distance:    Right Eye Near:   Left Eye Near:    Bilateral Near:     Physical Exam Vitals and nursing note reviewed.  Constitutional:      General: She is not in acute distress.    Appearance: She is not toxic-appearing.  HENT:     Mouth/Throat:     Mouth: Mucous membranes are moist.     Pharynx: Oropharynx is clear.  Pulmonary:     Effort: Pulmonary effort is normal. No respiratory distress.  Abdominal:     General: Abdomen is flat. Bowel sounds are normal. There is no distension.     Palpations: Abdomen is soft. There is no mass.     Tenderness: There is no abdominal tenderness. There is no right CVA tenderness, left CVA tenderness or guarding.  Skin:    General: Skin is warm and dry.     Coloration: Skin is not jaundiced or pale.     Findings: No erythema.  Neurological:     Mental Status: She is alert and oriented to person, place, and time.  Psychiatric:        Behavior: Behavior is cooperative.      UC Treatments / Results  Labs (all labs ordered are listed, but only abnormal results are displayed) Labs Reviewed  POCT URINALYSIS DIP (MANUAL ENTRY) - Abnormal; Notable for the following components:      Result Value   Color, UA orange (*)    Glucose, UA =100 (*)    Ketones, POC UA trace (5) (*)    Blood, UA trace-intact (*)    Protein Ur, POC =30 (*)    Urobilinogen, UA 2.0 (*)    Nitrite, UA Positive (*)    Leukocytes, UA Large (3+) (*)    All other components within normal limits  URINE CULTURE     EKG   Radiology No results found.  Procedures Procedures (including critical care time)  Medications Ordered in UC Medications - No data to display  Initial Impression / Assessment and Plan / UC Course  I have reviewed the triage vital signs and the nursing notes.  Pertinent labs & imaging results that were available during my care of the patient were reviewed by me and considered in my medical decision making (see chart for details).   Patient is well-appearing, afebrile, not tachycardic, not tachypneic, oxygenating well on room air.  Patient is mildly hypertensive today in urgent care.  Patient states this is normal for her at doctor's offices.  1. Dysuria Urinalysis today is unreliable secondary to Azo use Urine culture is pending Will treat based on symptoms with Augmentin twice daily for 5 days Supportive care discussed with patient and strict ER precautions also discussed  The patient was given the opportunity to ask questions.  All questions answered to their satisfaction.  The patient is in agreement to this plan.    Final Clinical Impressions(s) / UC Diagnoses   Final diagnoses:  Dysuria     Discharge Instructions      Urine culture is pending.  We will call you later this week if we need to change the antibiotic from the Augmentin.  Follow-up with your PCP if symptoms persist or worsen despite treatment or you can always follow-up here as well.    ED Prescriptions     Medication Sig Dispense Auth. Provider   amoxicillin-clavulanate (AUGMENTIN) 875-125 MG tablet Take 1 tablet by mouth 2 (two) times daily for 5 days. 10 tablet Valentino Nose, NP      PDMP not reviewed this encounter.   Valentino Nose, NP 05/21/23 1257

## 2023-05-23 LAB — URINE CULTURE: Culture: >=100000 — AB

## 2023-07-12 ENCOUNTER — Ambulatory Visit (INDEPENDENT_AMBULATORY_CARE_PROVIDER_SITE_OTHER): Payer: Medicare PPO

## 2023-07-12 VITALS — BP 122/74 | Ht 67.0 in | Wt 165.0 lb

## 2023-07-12 DIAGNOSIS — Z0000 Encounter for general adult medical examination without abnormal findings: Secondary | ICD-10-CM | POA: Diagnosis not present

## 2023-07-12 NOTE — Progress Notes (Signed)
 Because this visit was a virtual/telehealth visit,  certain criteria was not obtained, such a blood pressure, CBG if patient is a diabetic, and timed up and go. Any medications not marked as "taking" was not mentioned during the medication reconciliation part of the visit. Any vitals not documented were not able to be obtained due to this being a telehealth visit. Vitals documented are verbally provided by the patient.   Subjective:   Kristi Grant is a 89 y.o. female who presents for Medicare Annual (Subsequent) preventive examination.  Visit Complete: Virtual  I connected with  Kristi Grant on 07/12/23 by a audio enabled telemedicine application and verified that I am speaking with the correct person using two identifiers.  Patient Location: Home  Provider Location: Home Office  I discussed the limitations of evaluation and management by telemedicine. The patient expressed understanding and agreed to proceed.  Patient Medicare AWV questionnaire was completed by the patient on n/a; I have confirmed that all information answered by patient is correct and no changes since this date.  Review of Systems     Cardiac Risk Factors include: advanced age (>55men, >42 women);dyslipidemia;hypertension     Objective:    Today's Vitals   07/12/23 1129  BP: 122/74  Weight: 165 lb (74.8 kg)  Height: 5\' 7"  (1.702 m)   Body mass index is 25.84 kg/m.     07/12/2023   11:29 AM 07/03/2022    8:20 AM 06/13/2021   11:11 AM 01/28/2015    8:17 AM  Advanced Directives  Does Patient Have a Medical Advance Directive? No Yes Yes Yes  Type of Special educational needs teacher of [place];Living will Healthcare Power of [place];Living will   Does patient want to make changes to medical advance directive?   No - Patient declined   Copy of Healthcare Power of Attorney in Chart?  No - copy requested No - copy requested No - copy requested  Would patient like information on creating a medical advance  directive? No - Patient declined  No - Patient declined     Current Medications (verified) Outpatient Encounter Medications as of 07/12/2023  Medication Sig   allopurinol (ZYLOPRIM) 100 MG tablet Take 1 tablet (100 mg total) by mouth daily.   amLODipine (NORVASC) 2.5 MG tablet Take 1 tablet (2.5 mg total) by mouth daily.   pravastatin (PRAVACHOL) 20 MG tablet Take 1 tablet (20 mg total) by mouth daily.   cholecalciferol (VITAMIN D3) 25 MCG (1000 UNIT) tablet Take 1,000 Units by mouth daily.   Coenzyme Q10 (COQ-10) 100 MG CAPS Take by mouth daily.   fluticasone (FLONASE) 50 MCG/ACT nasal spray Place 1 spray into both nostrils 2 (two) times daily. (Patient not taking: Reported on 05/21/2023)   lidocaine (XYLOCAINE) 2 % solution Use as directed 10 mLs in the mouth or throat every 3 (three) hours as needed for mouth pain. (Patient not taking: Reported on 05/21/2023)   Methylcobalamin (B12-ACTIVE PO) Take by mouth.   No facility-administered encounter medications on file as of 07/12/2023.    Allergies (verified) Clindamycin/lincomycin, Ciprofloxacin, Flagyl [metronidazole], Hydrocodone, Iohexol, Nucynta [tapentadol], and Rocephin [ceftriaxone sodium in dextrose]   History: Past Medical History:  Diagnosis Date   Arthritis    Blood transfusion without reported diagnosis    Cataract    Hypertension    Neuromuscular disorder [facility])    Past Surgical History:  Procedure Laterality Date   ABDOMINAL HYSTERECTOMY     BASAL CELL CARCINOMA EXCISION  01/2011  nose   CATARACT EXTRACTION     Ovarian wedge resection  1962   REPLACEMENT TOTAL KNEE Left 05/2010   VAGINAL HYSTERECTOMY  1990   Family History  Problem Relation Age of Onset   Heart disease Mother    Heart disease Father    Breast cancer Sister    Cirrhosis Sister    Diabetes Sister    Breast cancer Maternal Grandmother    Ovarian cancer Maternal Grandmother    Social History   Socioeconomic History   Marital status: Married     Spouse name: Not on file   Number of children: Not on file   Years of education: Not on file   Highest education level: Not on file  Occupational History   Not on file  Tobacco Use   Smoking status: Never   Smokeless tobacco: Never  Vaping Use   Vaping status: Never Used  Substance and Sexual Activity   Alcohol use: No   Drug use: No   Sexual activity: Not Currently  Other Topics Concern   Not on file  Social History Narrative   Not on file   Social Determinants of Health   Financial Resource Strain: Low Risk  (07/12/2023)   Overall Financial Resource Strain (CARDIA)    Difficulty of Paying Living Expenses: Not hard at all  Food Insecurity: No Food Insecurity (07/12/2023)   Hunger Vital Sign    Worried About Running Out of Food in the Last Year: Never true    Ran Out of Food in the Last Year: Never true  Transportation Needs: No Transportation Needs (07/12/2023)   PRAPARE - Administrator, Civil Service (Medical): No    Lack of Transportation (Non-Medical): No  Physical Activity: Sufficiently Active (07/12/2023)   Exercise Vital Sign    Days of Exercise per Week: 7 days    Minutes of Exercise per Session: 30 min  Stress: No Stress Concern Present (07/12/2023)   [employer] of Occupational Health - Occupational Stress Questionnaire    Feeling of Stress : Not at all  Social Connections: Socially Integrated (07/12/2023)   Social Connection and Isolation Panel [NHANES]    Frequency of Communication with Friends and Family: More than three times a week    Frequency of Social Gatherings with Friends and Family: More than three times a week    Attends Religious Services: More than 4 times per year    Active Member of [employer] or Organizations: Yes    Attends Engineer, structural: More than 4 times per year    Marital Status: Married    Tobacco Counseling Counseling given: Yes   Clinical Intake:  Pre-visit preparation completed: Yes  Pain : No/denies  pain     BMI - recorded: 25.84 Nutritional Status: BMI 25 -29 Overweight Nutritional Risks: None Diabetes: No  How often do you need to have someone help you when you read instructions, pamphlets, or other written materials from your doctor or pharmacy?: 1 - Never  Interpreter Needed?: No  Information entered by ::  Jayleena Stille, CMA   Activities of Daily Living    07/12/2023   11:38 AM  In your present state of health, do you have any difficulty performing the following activities:  Hearing? 0  Vision? 0  Difficulty concentrating or making decisions? 0  Walking or climbing stairs? 0  Dressing or bathing? 0  Doing errands, shopping? 0  Preparing Food and eating ? N  Using the Toilet? N  In the  past six months, have you accidently leaked urine? N  Do you have problems with loss of bowel control? N  Managing your Medications? N  Managing your Finances? N  Housekeeping or managing your Housekeeping? N    Patient Care Team: Babs Sciara, MD as PCP - General (Family Medicine) Wyline Mood Dorothe Pea, MD as PCP - Cardiology (Cardiology)  Indicate any recent Medical Services you may have received from other than [facility] providers in the past year (date may be approximate).     Assessment:   This is a routine wellness examination for [facility].  Hearing/Vision screen Hearing Screening - Comments:: Patient denies any hearing difficulties.    Dietary issues and exercise activities discussed:     Goals Addressed               This Visit's Progress     Patient Stated (pt-stated)        Remain active and healthy. She still works in a garden, does her own lawn care, and visits friends regularly       Depression Screen    07/12/2023   11:34 AM 04/19/2023   10:43 AM 01/18/2023   10:18 AM 01/09/2023    9:24 AM 11/05/2022    2:33 PM 07/03/2022    8:18 AM 09/15/2021   11:27 AM  PHQ 2/9 Scores  PHQ - 2 Score 0 0 0 0 0 0 0  PHQ- 9 Score  0 0 0 0      Fall Risk    07/12/2023    11:37 AM 04/19/2023   10:43 AM 01/18/2023   10:18 AM 01/09/2023    9:24 AM 11/05/2022    2:33 PM  Fall Risk   Falls in the past year? 0 0  0 0  Number falls in past yr: 0 0 0 0 0  Injury with Fall? 0 0 0 0 0  Risk for fall due to : No Fall Risks    No Fall Risks  Follow up Falls prevention discussed    Falls evaluation completed    MEDICARE RISK AT HOME:  Medicare Risk at Home - 07/12/23 1136     Any stairs in or around the home? No    If so, are there any without handrails? No    Home free of loose throw rugs in walkways, pet beds, electrical cords, etc? Yes    Adequate lighting in your home to reduce risk of falls? Yes    Life alert? No    Use of a cane, walker or w/c? No    Grab bars in the bathroom? Yes    Shower chair or bench in shower? Yes    Elevated toilet seat or a handicapped toilet? Yes             TIMED UP AND GO:  Was the test performed?  No    Cognitive Function:        07/12/2023   11:37 AM 07/03/2022    8:23 AM  6CIT Screen  What Year? 0 points 0 points  What month? 0 points 0 points  What time? 0 points 0 points  Count back from 20 0 points 0 points  Months in reverse 0 points 0 points  Repeat phrase 0 points 0 points  Total Score 0 points 0 points    Immunizations Immunization History  Administered Date(s) Administered   Fluad Quad(high Dose 65+) 10/20/2020, 09/15/2021, 09/07/2022   Influenza, High Dose Seasonal PF 10/03/2018, 09/23/2019   Influenza-Unspecified  10/01/2013, 10/03/2015, 10/23/2017, 10/02/2018, 09/23/2019   Moderna SARS-COV2 Booster Vaccination 03/21/2021   Moderna Sars-Covid-2 Vaccination 01/28/2020, 02/26/2020, 08/31/2020   Pneumococcal Conjugate-13 07/01/2014   Pneumococcal-Unspecified 09/23/2010   Tdap 07/28/2020   Zoster Recombinant(Shingrix) 10/16/2017, 03/18/2018   Zoster, Live 12/25/2007    TDAP status: Up to date  Flu Vaccine status: Up to date  Pneumococcal vaccine status: Due, Education has been provided  regarding the importance of this vaccine. Advised may receive this vaccine at local pharmacy or Health Dept. Aware to provide a copy of the vaccination record if obtained from local pharmacy or Health Dept. Verbalized acceptance and understanding.  Covid-19 vaccine status: Information provided on how to obtain vaccines.   Qualifies for Shingles Vaccine? Yes   Zostavax completed Yes   Shingrix Completed?: Yes  Screening Tests Health Maintenance  Topic Date Due   Pneumonia Vaccine 66+ Years old (2 of 2 - PPSV23 or PCV20) 07/02/2015   COVID-19 Vaccine (4 - 2023-24 season) 08/24/2022   Medicare Annual Wellness (AWV)  07/04/2023   INFLUENZA VACCINE  07/25/2023   DTaP/Tdap/Td (2 - Td or Tdap) 07/28/2030   DEXA SCAN  Completed   Zoster Vaccines- Shingrix  Completed   HPV VACCINES  Aged Out    Health Maintenance  Health Maintenance Due  Topic Date Due   Pneumonia Vaccine 58+ Years old (2 of 2 - PPSV23 or PCV20) 07/02/2015   COVID-19 Vaccine (4 - 2023-24 season) 08/24/2022   Medicare Annual Wellness (AWV)  07/04/2023    Colorectal cancer screening: No longer required.   Mammogram status: No longer required due to age.  Bone Density status: Completed 07/31/2022. Results reflect: Bone density results: OSTEOPENIA. Repeat every 2 years.  Lung Cancer Screening: (Low Dose CT Chest recommended if Age 55-80 years, 20 pack-year currently smoking OR have quit w/in 15years.) does not qualify.   Lung Cancer Screening Referral: n/a  Additional Screening:  Hepatitis C Screening: does not qualify Vision Screening: Recommended annual ophthalmology exams for early detection of glaucoma and other disorders of the eye. Is the patient up to date with their annual eye exam?  Yes  Who is the provider or what is the name of the office in which the patient attends annual eye exams? Dr. Gerald Stabs If pt is not established with a provider, would they like to be referred to a provider to establish care?  No .   Dental Screening: Recommended annual dental exams for proper oral hygiene  Diabetic Foot Exam: n/a  Community Resource Referral / Chronic Care Management: CRR required this visit?  No   CCM required this visit?  No     Plan:     I have personally reviewed and noted the following in the patient's chart:   Medical and social history Use of alcohol, tobacco or illicit drugs  Current medications and supplements including opioid prescriptions. Patient is not currently taking opioid prescriptions. Functional ability and status Nutritional status Physical activity Advanced directives List of other physicians Hospitalizations, surgeries, and ER visits in previous 12 months Vitals Screenings to include cognitive, depression, and falls Referrals and appointments  In addition, I have reviewed and discussed with patient certain preventive protocols, quality metrics, and best practice recommendations. A written personalized care plan for preventive services as well as general preventive health recommendations were provided to patient.     Jordan Hawks Eisa Conaway, CMA   07/12/2023   After Visit Summary: (MyChart) Due to this being a telephonic visit, the after visit summary with patients  personalized plan was offered to patient via MyChart

## 2023-07-12 NOTE — Patient Instructions (Signed)
Ms. Biegler , Thank you for taking time to come for your Medicare Wellness Visit. I appreciate your ongoing commitment to your health goals. Please review the following plan we discussed and let me know if I can assist you in the future.   These are the goals we discussed:  Goals       Patient Stated      I would like to stay active as long as I can       Patient Stated (pt-stated)      Remain active and healthy. She still works in a garden, does her own lawn care, and visits friends regularly      Prevent falls        This is a list of the screening recommended for you and due dates:  Health Maintenance  Topic Date Due   Pneumonia Vaccine (2 of 2 - PPSV23 or PCV20) 07/02/2015   COVID-19 Vaccine (4 - 2023-24 season) 08/24/2022   Flu Shot  07/25/2023   Medicare Annual Wellness Visit  07/11/2024   DTaP/Tdap/Td vaccine (2 - Td or Tdap) 07/28/2030   DEXA scan (bone density measurement)  Completed   Zoster (Shingles) Vaccine  Completed   HPV Vaccine  Aged Out    Advanced directives: Advance directive discussed with you today. Even though you declined this today, please call our office should you change your mind, and we can give you the proper paperwork for you to fill out. Advance care planning is a way to make decisions about medical care that fits your values in case you are ever unable to make these decisions for yourself.  Information on Advanced Care Planning can be found at [facility] Advance Health Care Directives Advance Health Care Directives ([email or web address]    Conditions/risks identified:  You are due for the vaccines checked below. You may have these done at your preferred pharmacy. Please have them fax the office proof of the vaccines so that we can update your chart.   []  Flu (due annually) []  Shingrix (Shingles vaccine) [x]  Pneumonia Vaccines []  TDAP (Tetanus) Vaccine every 10 years [x]  Covid-19   Next appointment: VIRTUAL/TELEPHONE  APPOINTMENT Follow up in one year for your annual wellness visit  July 17, 2024 at 8:00 am TELEPHONE VISIT   Preventive Care 47 Years and Older, Female Preventive care refers to lifestyle choices and visits with your health care provider that can promote health and wellness. What does preventive care include? A yearly physical exam. This is also called an annual well check. Dental exams once or twice a year. Routine eye exams. Ask your health care provider how often you should have your eyes checked. Personal lifestyle choices, including: Daily care of your teeth and gums. Regular physical activity. Eating a healthy diet. Avoiding tobacco and drug use. Limiting alcohol use. Practicing safe sex. Taking low-dose aspirin every day. Taking vitamin and mineral supplements as recommended by your health care provider. What happens during an annual well check? The services and screenings done by your health care provider during your annual well check will depend on your age, overall health, lifestyle risk factors, and family history of disease. Counseling  Your health care provider may ask you questions about your: Alcohol use. Tobacco use. Drug use. Emotional well-being. Home and relationship well-being. Sexual activity. Eating habits. History of falls. Memory and ability to understand (cognition). Work and work Astronomer. Reproductive health. Screening  You may have the following tests or measurements: Height, weight, and BMI. Blood  pressure. Lipid and cholesterol levels. These may be checked every 5 years, or more frequently if you are over 34 years old. Skin check. Lung cancer screening. You may have this screening every year starting at age 18 if you have a 30-pack-year history of smoking and currently smoke or have quit within the past 15 years. Fecal occult blood test (FOBT) of the stool. You may have this test every year starting at age 19. Flexible sigmoidoscopy or  colonoscopy. You may have a sigmoidoscopy every 5 years or a colonoscopy every 10 years starting at age 30. Hepatitis C blood test. Hepatitis B blood test. Sexually transmitted disease (STD) testing. Diabetes screening. This is done by checking your blood sugar (glucose) after you have not eaten for a while (fasting). You may have this done every 1-3 years. Bone density scan. This is done to screen for osteoporosis. You may have this done starting at age 53. Mammogram. This may be done every 1-2 years. Talk to your health care provider about how often you should have regular mammograms. Talk with your health care provider about your test results, treatment options, and if necessary, the need for more tests. Vaccines  Your health care provider may recommend certain vaccines, such as: Influenza vaccine. This is recommended every year. Tetanus, diphtheria, and acellular pertussis (Tdap, Td) vaccine. You may need a Td booster every 10 years. Zoster vaccine. You may need this after age 72. Pneumococcal 13-valent conjugate (PCV13) vaccine. One dose is recommended after age 23. Pneumococcal polysaccharide (PPSV23) vaccine. One dose is recommended after age 30. Talk to your health care provider about which screenings and vaccines you need and how often you need them. This information is not intended to replace advice given to you by your health care provider. Make sure you discuss any questions you have with your health care provider. Document Released: 01/06/2016 Document Revised: 08/29/2016 Document Reviewed: 10/11/2015 Elsevier Interactive Patient Education  2017 [employer].  Fall Prevention in the Home Falls can cause injuries. They can happen to people of all ages. There are many things you can do to make your home safe and to help prevent falls. What can I do on the outside of my home? Regularly fix the edges of walkways and driveways and fix any cracks. Remove anything that might make you  trip as you walk through a door, such as a raised step or threshold. Trim any bushes or trees on the path to your home. Use bright outdoor lighting. Clear any walking paths of anything that might make someone trip, such as rocks or tools. Regularly check to see if handrails are loose or broken. Make sure that both sides of any steps have handrails. Any raised decks and porches should have guardrails on the edges. Have any leaves, snow, or ice cleared regularly. Use sand or salt on walking paths during winter. Clean up any spills in your garage right away. This includes oil or grease spills. What can I do in the bathroom? Use night lights. Install grab bars by the toilet and in the tub and shower. Do not use towel bars as grab bars. Use non-skid mats or decals in the tub or shower. If you need to sit down in the shower, use a plastic, non-slip stool. Keep the floor dry. Clean up any water that spills on the floor as soon as it happens. Remove soap buildup in the tub or shower regularly. Attach bath mats securely with double-sided non-slip rug tape. Do not have throw  rugs and other things on the floor that can make you trip. What can I do in the bedroom? Use night lights. Make sure that you have a light by your bed that is easy to reach. Do not use any sheets or blankets that are too big for your bed. They should not hang down onto the floor. Have a firm chair that has side arms. You can use this for support while you get dressed. Do not have throw rugs and other things on the floor that can make you trip. What can I do in the kitchen? Clean up any spills right away. Avoid walking on wet floors. Keep items that you use a lot in easy-to-reach places. If you need to reach something above you, use a strong step stool that has a grab bar. Keep electrical cords out of the way. Do not use floor polish or wax that makes floors slippery. If you must use wax, use non-skid floor wax. Do not have  throw rugs and other things on the floor that can make you trip. What can I do with my stairs? Do not leave any items on the stairs. Make sure that there are handrails on both sides of the stairs and use them. Fix handrails that are broken or loose. Make sure that handrails are as long as the stairways. Check any carpeting to make sure that it is firmly attached to the stairs. Fix any carpet that is loose or worn. Avoid having throw rugs at the top or bottom of the stairs. If you do have throw rugs, attach them to the floor with carpet tape. Make sure that you have a light switch at the top of the stairs and the bottom of the stairs. If you do not have them, ask someone to add them for you. What else can I do to help prevent falls? Wear shoes that: Do not have high heels. Have rubber bottoms. Are comfortable and fit you well. Are closed at the toe. Do not wear sandals. If you use a stepladder: Make sure that it is fully opened. Do not climb a closed stepladder. Make sure that both sides of the stepladder are locked into place. Ask someone to hold it for you, if possible. Clearly mark and make sure that you can see: Any grab bars or handrails. First and last steps. Where the edge of each step is. Use tools that help you move around (mobility aids) if they are needed. These include: Canes. Walkers. Scooters. Crutches. Turn on the lights when you go into a dark area. Replace any light bulbs as soon as they burn out. Set up your furniture so you have a clear path. Avoid moving your furniture around. If any of your floors are uneven, fix them. If there are any pets around you, be aware of where they are. Review your medicines with your doctor. Some medicines can make you feel dizzy. This can increase your chance of falling. Ask your doctor what other things that you can do to help prevent falls. This information is not intended to replace advice given to you by your health care provider.  Make sure you discuss any questions you have with your health care provider. Document Released: 10/06/2009 Document Revised: 05/17/2016 Document Reviewed: 01/14/2015 Elsevier Interactive Patient Education  2017 [employer].

## 2023-08-14 DIAGNOSIS — D485 Neoplasm of uncertain behavior of skin: Secondary | ICD-10-CM | POA: Diagnosis not present

## 2023-08-14 DIAGNOSIS — C44319 Basal cell carcinoma of skin of other parts of face: Secondary | ICD-10-CM | POA: Diagnosis not present

## 2023-08-14 DIAGNOSIS — L821 Other seborrheic keratosis: Secondary | ICD-10-CM | POA: Diagnosis not present

## 2023-08-14 DIAGNOSIS — L570 Actinic keratosis: Secondary | ICD-10-CM | POA: Diagnosis not present

## 2023-08-14 DIAGNOSIS — L538 Other specified erythematous conditions: Secondary | ICD-10-CM | POA: Diagnosis not present

## 2023-08-14 DIAGNOSIS — R208 Other disturbances of skin sensation: Secondary | ICD-10-CM | POA: Diagnosis not present

## 2023-08-14 DIAGNOSIS — Z789 Other specified health status: Secondary | ICD-10-CM | POA: Diagnosis not present

## 2023-08-14 DIAGNOSIS — L820 Inflamed seborrheic keratosis: Secondary | ICD-10-CM | POA: Diagnosis not present

## 2023-08-14 DIAGNOSIS — L298 Other pruritus: Secondary | ICD-10-CM | POA: Diagnosis not present

## 2023-09-02 ENCOUNTER — Other Ambulatory Visit: Payer: Self-pay | Admitting: Family Medicine

## 2023-09-09 DIAGNOSIS — C44319 Basal cell carcinoma of skin of other parts of face: Secondary | ICD-10-CM | POA: Diagnosis not present

## 2023-10-02 ENCOUNTER — Other Ambulatory Visit: Payer: Self-pay | Admitting: Family Medicine

## 2023-10-10 DIAGNOSIS — I10 Essential (primary) hypertension: Secondary | ICD-10-CM | POA: Diagnosis not present

## 2023-10-11 LAB — BASIC METABOLIC PANEL (7)
BUN/Creatinine Ratio: 23 (ref 12–28)
BUN: 20 mg/dL (ref 8–27)
CO2: 25 mmol/L (ref 20–29)
Chloride: 101 mmol/L (ref 96–106)
Creatinine, Ser: 0.86 mg/dL (ref 0.57–1.00)
Glucose: 82 mg/dL (ref 70–99)
Potassium: 4.6 mmol/L (ref 3.5–5.2)
Sodium: 141 mmol/L (ref 134–144)
eGFR: 66 mL/min/{1.73_m2} (ref >59)

## 2023-10-11 LAB — MICROALBUMIN / CREATININE URINE RATIO
Creatinine, Urine: 72.6 mg/dL
Microalb/Creat Ratio: 39 mg/g{creat} — ABNORMAL HIGH (ref 0–29)
Microalbumin, Urine: 28.2 ug/mL

## 2023-10-14 ENCOUNTER — Ambulatory Visit: Payer: Medicare PPO | Admitting: Family Medicine

## 2023-10-14 ENCOUNTER — Encounter: Payer: Self-pay | Admitting: Family Medicine

## 2023-10-14 VITALS — BP 142/86 | HR 75 | Wt 169.6 lb

## 2023-10-14 DIAGNOSIS — Z23 Encounter for immunization: Secondary | ICD-10-CM | POA: Diagnosis not present

## 2023-10-14 DIAGNOSIS — I10 Essential (primary) hypertension: Secondary | ICD-10-CM | POA: Diagnosis not present

## 2023-10-14 DIAGNOSIS — E7849 Other hyperlipidemia: Secondary | ICD-10-CM | POA: Diagnosis not present

## 2023-10-14 MED ORDER — PRAVASTATIN SODIUM 20 MG PO TABS: 20.0000 mg | ORAL_TABLET | Freq: Every day | ORAL | 1 refills | Status: DC

## 2023-10-14 MED ORDER — ALLOPURINOL 100 MG PO TABS: 100.0000 mg | ORAL_TABLET | Freq: Every day | ORAL | 1 refills | Status: DC

## 2023-10-14 MED ORDER — AMLODIPINE BESYLATE 2.5 MG PO TABS: 2.5000 mg | ORAL_TABLET | Freq: Every day | ORAL | 1 refills | Status: DC

## 2023-10-14 NOTE — Progress Notes (Signed)
Subjective:    Patient ID: Kristi Grant, female    DOB: June 02, 1934, 89 y.o.   MRN: [id number]  Discussed the use of AI scribe software for clinical note transcription with the patient, who gave verbal consent to proceed.  History of Present Illness   The patient, with a history of hypertension, reports occasional episodes of hypotension, feeling "washed out" during these spells. These episodes are infrequent, occurring approximately once every three to four weeks, and are more common during the summer months when the patient gets hot. The patient manages these episodes by consuming caffeine and refraining from activity for the day. She reports moderate salt use in her diet.  The patient denies any respiratory issues or chest discomfort. She notes a decrease in stamina, attributing it to aging, and now requires breaks during physical activities such as gardening. Despite this, she is able to resume activity after a rest period.  The patient reports frequent nocturia, attributing it to discomfort from lying on one side for extended periods due to shoulder pain. She denies any recent falls and has adapted to standing still upon rising to maintain balance.  The patient is compliant with her medication regimen, taking amlodipine and pravastatin in the evenings, and allopurinol in the mornings. She occasionally forgets to take the allopurinol. She reports no changes in bowel habits and denies any blood in her stool.  The patient recently underwent Mohs surgery for basal cell carcinoma and reports a satisfactory recovery. She also mentions the presence of lipomas on her arm, which have been present for several years and do not cause significant discomfort.         Review of Systems     Objective:    Physical Exam   VITALS: BP- 142/86 CHEST: Lungs clear to auscultation CARDIOVASCULAR: Heart sounds normal EXTREMITIES: No significant edema SKIN: Lipomas on deltoid and bicep       Her blood  work was reviewed kidney functions look good minimal protein in the urine     Assessment & Plan:  Assessment and Plan    Orthostatic Hypotension Infrequent episodes of low blood pressure causing fatigue. Episodes are managed with caffeine and rest. No syncope reported. -Continue current management strategy. -Encouraged to use salt in diet to help maintain blood pressure.  Nocturia Frequent urination at night due to changing positions due to shoulder pain. No falls reported. -Continue current management strategy. -Advised to use light when getting up at night to prevent falls.  Hyperlipidemia Well managed on Pravastatin. -Continue Pravastatin. -Refill prescription for 90 days.  Hypertension Well managed on Amlodipine. -Continue Amlodipine. -Refill prescription for 90 days.  Gout Well managed on Allopurinol. -Continue Allopurinol. -Refill prescription for 90 days.  Lipomas Two lipomas noted on the arm, no discomfort or functional impairment reported. -No intervention required at this time.  General Health Maintenance -Administer Pneumococcal 20 vaccine today. -Consider COVID-19 booster shot. Patient to think about it. -Continue regular physical activity as tolerated. -Follow-up in 6 months with labs prior to visit.     Continue statins we will do blood work in the spring time Continue allopurinol blood work in the spring  Her blood pressure is doing well   1. Needs flu shot Shot today - Flu Vaccine Trivalent High Dose (Fluad)  2. Essential hypertension, benign Blood pressure doing well but occasionally does drop her blood pressure is reasonable for her age but when she has some of these lower drops sometimes makes her feel like she needs to sit  down these are not happening frequently  3. Other hyperlipidemia Healthy diet regular activity continue pravastatin  4. Need for vaccination As per above pneumococcal - Pneumococcal conjugate vaccine 20-valent (Prevnar  20)

## 2023-11-15 DIAGNOSIS — L538 Other specified erythematous conditions: Secondary | ICD-10-CM | POA: Diagnosis not present

## 2023-11-15 DIAGNOSIS — Z85828 Personal history of other malignant neoplasm of skin: Secondary | ICD-10-CM | POA: Diagnosis not present

## 2023-11-15 DIAGNOSIS — Z789 Other specified health status: Secondary | ICD-10-CM | POA: Diagnosis not present

## 2023-11-15 DIAGNOSIS — L2989 Other pruritus: Secondary | ICD-10-CM | POA: Diagnosis not present

## 2023-11-15 DIAGNOSIS — L820 Inflamed seborrheic keratosis: Secondary | ICD-10-CM | POA: Diagnosis not present

## 2023-11-15 DIAGNOSIS — Z08 Encounter for follow-up examination after completed treatment for malignant neoplasm: Secondary | ICD-10-CM | POA: Diagnosis not present

## 2024-01-29 ENCOUNTER — Ambulatory Visit
Admission: EM | Admit: 2024-01-29 | Discharge: 2024-01-29 | Disposition: A | Discharge status: Home / Self Care | Payer: Medicare PPO | Attending: Nurse Practitioner | Admitting: Nurse Practitioner

## 2024-01-29 ENCOUNTER — Inpatient Hospital Stay (HOSPITAL_COMMUNITY)
Admission: EM | Admit: 2024-01-29 | Discharge: 2024-02-03 | DRG: 193 | Disposition: A | Discharge status: Home Health | Payer: Medicare PPO | Attending: Family Medicine | Admitting: Family Medicine

## 2024-01-29 ENCOUNTER — Other Ambulatory Visit: Payer: Self-pay

## 2024-01-29 ENCOUNTER — Emergency Department (HOSPITAL_COMMUNITY): Payer: Medicare PPO

## 2024-01-29 ENCOUNTER — Encounter (HOSPITAL_COMMUNITY): Payer: Self-pay

## 2024-01-29 DIAGNOSIS — Z85828 Personal history of other malignant neoplasm of skin: Secondary | ICD-10-CM

## 2024-01-29 DIAGNOSIS — J449 Chronic obstructive pulmonary disease, unspecified: Secondary | ICD-10-CM | POA: Diagnosis present

## 2024-01-29 DIAGNOSIS — Z833 Family history of diabetes mellitus: Secondary | ICD-10-CM | POA: Diagnosis not present

## 2024-01-29 DIAGNOSIS — J9601 Acute respiratory failure with hypoxia: Secondary | ICD-10-CM | POA: Diagnosis present

## 2024-01-29 DIAGNOSIS — Z1152 Encounter for screening for COVID-19: Secondary | ICD-10-CM | POA: Diagnosis not present

## 2024-01-29 DIAGNOSIS — Z881 Allergy status to other antibiotic agents status: Secondary | ICD-10-CM | POA: Diagnosis not present

## 2024-01-29 DIAGNOSIS — Z90710 Acquired absence of both cervix and uterus: Secondary | ICD-10-CM

## 2024-01-29 DIAGNOSIS — E785 Hyperlipidemia, unspecified: Secondary | ICD-10-CM | POA: Diagnosis present

## 2024-01-29 DIAGNOSIS — Z96652 Presence of left artificial knee joint: Secondary | ICD-10-CM | POA: Diagnosis present

## 2024-01-29 DIAGNOSIS — R0602 Shortness of breath: Secondary | ICD-10-CM | POA: Diagnosis not present

## 2024-01-29 DIAGNOSIS — R0902 Hypoxemia: Principal | ICD-10-CM

## 2024-01-29 DIAGNOSIS — Z79899 Other long term (current) drug therapy: Secondary | ICD-10-CM

## 2024-01-29 DIAGNOSIS — J101 Influenza due to other identified influenza virus with other respiratory manifestations: Secondary | ICD-10-CM | POA: Diagnosis present

## 2024-01-29 DIAGNOSIS — Z883 Allergy status to other anti-infective agents status: Secondary | ICD-10-CM

## 2024-01-29 DIAGNOSIS — I700 Atherosclerosis of aorta: Secondary | ICD-10-CM | POA: Diagnosis not present

## 2024-01-29 DIAGNOSIS — Z888 Allergy status to other drugs, medicaments and biological substances status: Secondary | ICD-10-CM

## 2024-01-29 DIAGNOSIS — Z8249 Family history of ischemic heart disease and other diseases of the circulatory system: Secondary | ICD-10-CM | POA: Diagnosis not present

## 2024-01-29 DIAGNOSIS — Z885 Allergy status to narcotic agent status: Secondary | ICD-10-CM

## 2024-01-29 DIAGNOSIS — I10 Essential (primary) hypertension: Secondary | ICD-10-CM | POA: Diagnosis present

## 2024-01-29 DIAGNOSIS — N3281 Overactive bladder: Secondary | ICD-10-CM | POA: Diagnosis present

## 2024-01-29 LAB — BASIC METABOLIC PANEL
Anion gap: 10 (ref 5–15)
BUN: 16 mg/dL (ref 8–23)
CO2: 27 mmol/L (ref 22–32)
Calcium: 9.3 mg/dL (ref 8.9–10.3)
Chloride: 97 mmol/L — ABNORMAL LOW (ref 98–111)
Creatinine, Ser: 0.75 mg/dL (ref 0.44–1.00)
GFR, Estimated: >60 mL/min (ref >60)
Glucose, Bld: 109 mg/dL — ABNORMAL HIGH (ref 70–99)
Potassium: 3.8 mmol/L (ref 3.5–5.1)
Sodium: 134 mmol/L — ABNORMAL LOW (ref 135–145)

## 2024-01-29 LAB — CBC WITH DIFFERENTIAL/PLATELET
Abs Immature Granulocytes: 0.01 10*3/uL (ref 0.00–0.07)
Basophils Absolute: 0 10*3/uL (ref 0.0–0.1)
Basophils Relative: 0 %
Eosinophils Absolute: 0 10*3/uL (ref 0.0–0.5)
Eosinophils Relative: 0 %
HCT: 32.9 % — ABNORMAL LOW (ref 36.0–46.0)
Hemoglobin: 11.1 g/dL — ABNORMAL LOW (ref 12.0–15.0)
Immature Granulocytes: 0 %
Lymphocytes Relative: 50 %
Lymphs Abs: 1.5 10*3/uL (ref 0.7–4.0)
MCH: 31 pg (ref 26.0–34.0)
MCHC: 33.7 g/dL (ref 30.0–36.0)
MCV: 91.9 fL (ref 80.0–100.0)
Monocytes Absolute: 0.4 10*3/uL (ref 0.1–1.0)
Monocytes Relative: 15 %
Neutro Abs: 1 10*3/uL — ABNORMAL LOW (ref 1.7–7.7)
Neutrophils Relative %: 35 %
Platelets: 171 10*3/uL (ref 150–400)
RBC: 3.58 MIL/uL — ABNORMAL LOW (ref 3.87–5.11)
RDW: 14.2 % (ref 11.5–15.5)
Smear Review: ADEQUATE
WBC: 3 10*3/uL — ABNORMAL LOW (ref 4.0–10.5)
nRBC: 0 % (ref 0.0–0.2)

## 2024-01-29 LAB — TROPONIN I (HIGH SENSITIVITY)
Troponin I (High Sensitivity): 7 ng/L (ref <18)
Troponin I (High Sensitivity): 9 ng/L (ref <18)

## 2024-01-29 LAB — RESP PANEL BY RT-PCR (RSV, FLU A&B, COVID)  RVPGX2
Influenza A by PCR: POSITIVE — AB
Influenza B by PCR: NEGATIVE
Resp Syncytial Virus by PCR: NEGATIVE
SARS Coronavirus 2 by RT PCR: NEGATIVE

## 2024-01-29 LAB — BRAIN NATRIURETIC PEPTIDE: B Natriuretic Peptide: 38 pg/mL (ref 0.0–100.0)

## 2024-01-29 MED ORDER — OSELTAMIVIR PHOSPHATE 30 MG PO CAPS
30.0000 mg | ORAL_CAPSULE | Freq: Two times a day (BID) | ORAL | Status: AC
  Administered 2024-01-29 – 2024-02-02 (×10): 30 mg via ORAL
  Filled 2024-01-29 (×10): qty 1

## 2024-01-29 MED ORDER — HYDRALAZINE HCL 20 MG/ML IJ SOLN
10.0000 mg | Freq: Four times a day (QID) | INTRAMUSCULAR | Status: DC | PRN
  Administered 2024-01-30 – 2024-01-31 (×2): 10 mg via INTRAVENOUS
  Filled 2024-01-29 (×2): qty 1

## 2024-01-29 MED ORDER — BISACODYL 10 MG RE SUPP: 10.0000 mg | Freq: Every day | RECTAL | Status: DC | PRN

## 2024-01-29 MED ORDER — SODIUM CHLORIDE 0.9% FLUSH
3.0000 mL | INTRAVENOUS | Status: DC | PRN
  Administered 2024-01-30: 3 mL via INTRAVENOUS

## 2024-01-29 MED ORDER — ALLOPURINOL 100 MG PO TABS
100.0000 mg | ORAL_TABLET | Freq: Every day | ORAL | Status: DC
  Administered 2024-01-29 – 2024-02-03 (×6): 100 mg via ORAL
  Filled 2024-01-29 (×6): qty 1

## 2024-01-29 MED ORDER — VITAMIN D 25 MCG (1000 UNIT) PO TABS
1000.0000 [IU] | ORAL_TABLET | Freq: Every day | ORAL | Status: DC
  Administered 2024-01-30 – 2024-02-03 (×5): 1000 [IU] via ORAL
  Filled 2024-01-29 (×5): qty 1

## 2024-01-29 MED ORDER — METHYLPREDNISOLONE SODIUM SUCC 40 MG IJ SOLR
40.0000 mg | Freq: Two times a day (BID) | INTRAMUSCULAR | Status: DC
  Administered 2024-01-29 – 2024-02-03 (×10): 40 mg via INTRAVENOUS
  Filled 2024-01-29 (×10): qty 1

## 2024-01-29 MED ORDER — SODIUM CHLORIDE 0.9 % IV SOLN: INTRAVENOUS | Status: AC | PRN

## 2024-01-29 MED ORDER — SODIUM CHLORIDE 0.9% FLUSH
3.0000 mL | Freq: Two times a day (BID) | INTRAVENOUS | Status: DC
  Administered 2024-01-29 – 2024-02-03 (×10): 3 mL via INTRAVENOUS

## 2024-01-29 MED ORDER — AMLODIPINE BESYLATE 5 MG PO TABS
10.0000 mg | ORAL_TABLET | Freq: Every day | ORAL | Status: DC
  Administered 2024-01-29: 10 mg via ORAL
  Filled 2024-01-29: qty 2

## 2024-01-29 MED ORDER — HEPARIN SODIUM (PORCINE) 5000 UNIT/ML IJ SOLN
5000.0000 [IU] | Freq: Three times a day (TID) | INTRAMUSCULAR | Status: DC
  Administered 2024-01-29 – 2024-02-03 (×14): 5000 [IU] via SUBCUTANEOUS
  Filled 2024-01-29 (×14): qty 1

## 2024-01-29 MED ORDER — SODIUM CHLORIDE 0.9% FLUSH
3.0000 mL | Freq: Two times a day (BID) | INTRAVENOUS | Status: DC
  Administered 2024-01-29 – 2024-02-02 (×5): 3 mL via INTRAVENOUS

## 2024-01-29 MED ORDER — POLYETHYLENE GLYCOL 3350 17 G PO PACK
17.0000 g | PACK | Freq: Every day | ORAL | Status: DC | PRN
Start: 2024-01-29 — End: 2024-02-03

## 2024-01-29 MED ORDER — ACETAMINOPHEN 650 MG RE SUPP: 650.0000 mg | Freq: Four times a day (QID) | RECTAL | Status: DC | PRN

## 2024-01-29 MED ORDER — ALBUTEROL SULFATE HFA 108 (90 BASE) MCG/ACT IN AERS: 2.0000 | INHALATION_SPRAY | RESPIRATORY_TRACT | Status: DC | PRN

## 2024-01-29 MED ORDER — ACETAMINOPHEN 325 MG PO TABS: 650.0000 mg | ORAL_TABLET | Freq: Four times a day (QID) | ORAL | Status: DC | PRN

## 2024-01-29 MED ORDER — ONDANSETRON HCL 4 MG PO TABS: 4.0000 mg | ORAL_TABLET | Freq: Four times a day (QID) | ORAL | Status: DC | PRN

## 2024-01-29 MED ORDER — TRAZODONE HCL 50 MG PO TABS
50.0000 mg | ORAL_TABLET | Freq: Every evening | ORAL | Status: DC | PRN
  Filled 2024-01-29: qty 1

## 2024-01-29 MED ORDER — IPRATROPIUM-ALBUTEROL 0.5-2.5 (3) MG/3ML IN SOLN
3.0000 mL | Freq: Three times a day (TID) | RESPIRATORY_TRACT | Status: DC
  Administered 2024-01-30 – 2024-02-03 (×13): 3 mL via RESPIRATORY_TRACT
  Filled 2024-01-29 (×14): qty 3

## 2024-01-29 MED ORDER — IPRATROPIUM-ALBUTEROL 0.5-2.5 (3) MG/3ML IN SOLN
3.0000 mL | Freq: Once | RESPIRATORY_TRACT | Status: AC
  Administered 2024-01-29: 3 mL via RESPIRATORY_TRACT

## 2024-01-29 MED ORDER — IPRATROPIUM-ALBUTEROL 0.5-2.5 (3) MG/3ML IN SOLN
3.0000 mL | Freq: Four times a day (QID) | RESPIRATORY_TRACT | Status: DC
  Administered 2024-01-29 (×2): 3 mL via RESPIRATORY_TRACT
  Filled 2024-01-29 (×2): qty 3

## 2024-01-29 MED ORDER — ONDANSETRON HCL 4 MG/2ML IJ SOLN: 4.0000 mg | Freq: Four times a day (QID) | INTRAMUSCULAR | Status: DC | PRN

## 2024-01-29 MED ORDER — DM-GUAIFENESIN ER 30-600 MG PO TB12
1.0000 | ORAL_TABLET | Freq: Two times a day (BID) | ORAL | Status: DC
  Administered 2024-01-29 – 2024-02-03 (×11): 1 via ORAL
  Filled 2024-01-29 (×11): qty 1

## 2024-01-29 NOTE — ED Provider Notes (Signed)
 [facility]    CSN: [id number] Arrival date & time: 01/29/24  1035      History   Chief Complaint No chief complaint on file.   HPI Kristi Grant is a 89 y.o. female.   Patient seen today with son for 4-day history of shortness of breath, chest tightness, and coughing whenever she breathes.  She also endorses hoarseness.  No fever, body aches or chills, abdominal pain, nausea/vomiting, or diarrhea.  Denies history of chronic lung disease.  Reports symptoms have gotten worse over the past 24 to 48 hours.  No known sick contacts.    Past Medical History:  Diagnosis Date   Arthritis    Blood transfusion without reported diagnosis    Cataract    Hypertension    Neuromuscular disorder [facility])     Patient Active Problem List   Diagnosis Date Noted   Gout 01/24/2016   Osteopenia 01/24/2016   History of left knee replacement 06/28/2015   Essential hypertension, benign 09/01/2013   Hyperlipemia 09/01/2013   UTI (urinary tract infection) 06/28/2013    Past Surgical History:  Procedure Laterality Date   ABDOMINAL HYSTERECTOMY     BASAL CELL CARCINOMA EXCISION  01/2011   nose   CATARACT EXTRACTION     Ovarian wedge resection  1962   REPLACEMENT TOTAL KNEE Left 05/2010   VAGINAL HYSTERECTOMY  1990    OB History   No obstetric history on file.      Home Medications    Prior to Admission medications   Medication Sig Start Date End Date Taking? Authorizing Provider  allopurinol  (ZYLOPRIM ) 100 MG tablet Take 1 tablet (100 mg total) by mouth daily. 10/14/23   Alphonsa Glendia LABOR, MD  amLODipine  (NORVASC ) 2.5 MG tablet Take 1 tablet (2.5 mg total) by mouth daily. 10/14/23   Alphonsa Glendia LABOR, MD  cholecalciferol  (VITAMIN D3) 25 MCG (1000 UNIT) tablet Take 1,000 Units by mouth daily.    [provider]  Coenzyme Q10 (COQ-10) 100 MG CAPS Take by mouth daily.    [provider]  Methylcobalamin (B12-ACTIVE PO) Take by mouth.    [provider]   pravastatin  (PRAVACHOL ) 20 MG tablet Take 1 tablet (20 mg total) by mouth daily. 10/14/23   Alphonsa Glendia LABOR, MD    Family History Family History  Problem Relation Age of Onset   Heart disease Mother    Heart disease Father    Breast cancer Sister    Cirrhosis Sister    Diabetes Sister    Breast cancer Maternal Grandmother    Ovarian cancer Maternal Grandmother     Social History Social History   Tobacco Use   Smoking status: Never   Smokeless tobacco: Never  Vaping Use   Vaping status: Never Used  Substance Use Topics   Alcohol use: No   Drug use: No     Allergies   Clindamycin/lincomycin, Ciprofloxacin , Flagyl  [metronidazole ], Hydrocodone, Iohexol, Nucynta [tapentadol], and Rocephin  [ceftriaxone  sodium in dextrose]   Review of Systems Review of Systems Per HPI  Physical Exam Triage Vital Signs ED Triage Vitals  Encounter Vitals Group     BP 01/29/24 1050 (!) 177/85     Systolic BP Percentile --      Diastolic BP Percentile --      Pulse Rate 01/29/24 1126 88     Resp 01/29/24 1050 16     Temp 01/29/24 1050 99.5 F (37.5 C)     Temp src --  SpO2 01/29/24 1050 (!) 84 %     Weight --      Height --      Head Circumference --      Peak Flow --      Pain Score 01/29/24 1053 0     Pain Loc --      Pain Education --      Exclude from Growth Chart --    No data found.  Updated Vital Signs BP (!) 177/85 (BP Location: Right Arm)   Pulse 88   Temp 99.5 F (37.5 C)   Resp 18   SpO2 (!) 86% Comment: several minutes post nebulizer. NP at bedside.  Visual Acuity Right Eye Distance:   Left Eye Distance:   Bilateral Distance:    Right Eye Near:   Left Eye Near:    Bilateral Near:     Physical Exam Vitals and nursing note reviewed.  Constitutional:      General: She is not in acute distress.    Appearance: Normal appearance. She is not ill-appearing or toxic-appearing.  HENT:     Head: Normocephalic and atraumatic.     Right Ear: External ear  normal.     Left Ear: External ear normal.     Mouth/Throat:     Mouth: Mucous membranes are moist.     Pharynx: Oropharynx is clear. No posterior oropharyngeal erythema.  Eyes:     Extraocular Movements: Extraocular movements intact.  Cardiovascular:     Rate and Rhythm: Normal rate and regular rhythm.  Pulmonary:     Effort: Pulmonary effort is normal. No respiratory distress.     Breath sounds: Decreased breath sounds, wheezing and rhonchi present. No rales.  Musculoskeletal:     Cervical back: Normal range of motion and neck supple.  Lymphadenopathy:     Cervical: No cervical adenopathy.  Skin:    General: Skin is warm and dry.     Coloration: Skin is not jaundiced or pale.     Findings: No erythema or rash.  Neurological:     Mental Status: She is alert and oriented to person, place, and time.  Psychiatric:        Behavior: Behavior is cooperative.      UC Treatments / Results  Labs (all labs ordered are listed, but only abnormal results are displayed) Labs Reviewed - No data to display  EKG   Radiology No results found.  Procedures Procedures (including critical care time)  Medications Ordered in UC Medications  ipratropium-albuterol  (DUONEB) 0.5-2.5 (3) MG/3ML nebulizer solution 3 mL (3 mLs Nebulization Given 01/29/24 1057)    Initial Impression / Assessment and Plan / UC Course  I have reviewed the triage vital signs and the nursing notes.  Pertinent labs & imaging results that were available during my care of the patient were reviewed by me and considered in my medical decision making (see chart for details).   In triage, patient is hypertensive and hypoxic on room air.  After DuoNeb, O2 sat increased temporarily, but then patient desatted to 85% on room air.  1. Acute respiratory failure with hypoxia (HCC) In triage, patient is hypoxic and this persists even after DuoNeb I am concerned for acute pulmonary etiology such as pneumonia I recommended  further evaluation and management in the emergency room Patient declines EMS transfer, son is with her and is agreeable to take her to the emergency room for further evaluation and management Patient resents risks of proceeding via private vehicle Patient left  urgent care in stable condition  The patient was given the opportunity to ask questions.  All questions answered to their satisfaction.  The patient is in agreement to this plan.    Final Clinical Impressions(s) / UC Diagnoses   Final diagnoses:  Acute respiratory failure with hypoxia [facility])     Discharge Instructions      Please go directly to the emergency room for further evaluation management of your symptoms   ED Prescriptions   None    PDMP not reviewed this encounter.   Chandra Harlene LABOR, NP 01/29/24 1145

## 2024-01-29 NOTE — Discharge Instructions (Signed)
Please go directly to the emergency room for further evaluation management of your symptoms

## 2024-01-29 NOTE — ED Notes (Addendum)
 Patient is being discharged from the Urgent Care and sent to the Emergency Department via POV . Per NP, patient is in need of higher level of care due to hypoxia. Patient is aware and verbalizes understanding of plan of care.  Vitals:   01/29/24 1057 01/29/24 1126  BP:    Pulse:  88  Resp: 18 18  Temp:    SpO2: 94% (!) 86%   Pt son is transporting pt POV. Pt alert and oriented.

## 2024-01-29 NOTE — ED Provider Notes (Signed)
 [place] EMERGENCY DEPARTMENT AT [facility] Provider Note   CSN: [id number] Arrival date & time: 01/29/24  1146     History  Chief Complaint  Patient presents with   Shortness of Breath    Kristi Grant is a 89 y.o. female.  89 year old female with a history of hypertension and hyperlipidemia who presents emergency department with cough and generalized fatigue.  Patient reports that on Friday she started feeling sick.  Was having generalized fatigue as well as a nonproductive cough.  Also had some mild shortness of breath.  Went to urgent care today to get checked out and was noted to be hypoxic and was referred to the emergency department after receiving a DuoNeb.  Says that she only smoked for a very brief period of time in college.  Denies fevers, chest pain, leg swelling.       Home Medications Prior to Admission medications   Medication Sig Start Date End Date Taking? Authorizing Provider  amLODipine  (NORVASC ) 2.5 MG tablet Take 1 tablet (2.5 mg total) by mouth daily. 10/14/23  Yes Luking, Glendia LABOR, MD  cholecalciferol  (VITAMIN D3) 25 MCG (1000 UNIT) tablet Take 1,000 Units by mouth daily.   Yes [provider]  Coenzyme Q10 (COQ-10) 100 MG CAPS Take by mouth daily.   Yes [provider]  Methylcobalamin (B12-ACTIVE PO) Take by mouth.   Yes [provider]  pravastatin  (PRAVACHOL ) 20 MG tablet Take 1 tablet (20 mg total) by mouth daily. 10/14/23  Yes Luking, Glendia LABOR, MD  allopurinol  (ZYLOPRIM ) 100 MG tablet Take 1 tablet (100 mg total) by mouth daily. Patient not taking: Reported on 01/29/2024 10/14/23   Alphonsa Glendia LABOR, MD      Allergies    Clindamycin/lincomycin, Ciprofloxacin , Flagyl  [metronidazole ], Hydrocodone, Iohexol, Nucynta [tapentadol], and Rocephin  [ceftriaxone  sodium in dextrose]    Review of Systems   Review of Systems  Physical Exam Updated Vital Signs BP 134/75 (BP Location: Left Arm)   Pulse 90   Temp 98.7 F (37.1  C)   Resp 18   Ht 5' 7 (1.702 m)   Wt 75.8 kg   SpO2 (!) 88%   BMI 26.17 kg/m  Physical Exam Vitals and nursing note reviewed.  Constitutional:      General: She is not in acute distress.    Appearance: She is well-developed.     Comments: Speaking full sentences.  On 3 L nasal cannula.  HENT:     Head: Normocephalic and atraumatic.     Right Ear: External ear normal.     Left Ear: External ear normal.     Nose: Nose normal.  Eyes:     Extraocular Movements: Extraocular movements intact.     Conjunctiva/sclera: Conjunctivae normal.     Pupils: Pupils are equal, round, and reactive to light.  Cardiovascular:     Rate and Rhythm: Normal rate and regular rhythm.     Heart sounds: No murmur heard. Pulmonary:     Effort: Pulmonary effort is normal. No respiratory distress.     Breath sounds: Rhonchi present.  Musculoskeletal:     Cervical back: Normal range of motion and neck supple.     Right lower leg: No edema.     Left lower leg: No edema.  Skin:    General: Skin is warm and dry.  Neurological:     Mental Status: She is alert and oriented to person, place, and time. Mental status is at baseline.  Psychiatric:  Mood and Affect: Mood normal.     ED Results / Procedures / Treatments   Labs (all labs ordered are listed, but only abnormal results are displayed) Labs Reviewed  RESP PANEL BY RT-PCR (RSV, FLU A&B, COVID)  RVPGX2 - Abnormal; Notable for the following components:      Result Value   Influenza A by PCR POSITIVE (*)    All other components within normal limits  CBC WITH DIFFERENTIAL/PLATELET - Abnormal; Notable for the following components:   WBC 3.0 (*)    RBC 3.58 (*)    Hemoglobin 11.1 (*)    HCT 32.9 (*)    Neutro Abs 1.0 (*)    All other components within normal limits  BASIC METABOLIC PANEL - Abnormal; Notable for the following components:   Sodium 134 (*)    Chloride 97 (*)    Glucose, Bld 109 (*)    All other components within normal  limits  BRAIN NATRIURETIC PEPTIDE  TROPONIN I (HIGH SENSITIVITY)  TROPONIN I (HIGH SENSITIVITY)    EKG None  Radiology DG Chest 2 View Result Date: 01/29/2024 CLINICAL DATA:  Shortness of breath. EXAM: CHEST - 2 VIEW COMPARISON:  Chest radiograph dated 06/16/2010. FINDINGS: No focal consolidation, pleural effusion, or pneumothorax. The cardiac silhouette is within normal limits. Atherosclerotic calcification of the aorta. No acute osseous pathology. IMPRESSION: No active cardiopulmonary disease. Electronically Signed   By: Vanetta Chou M.D.   On: 01/29/2024 14:46    Procedures Procedures    Medications Ordered in ED Medications  albuterol  (VENTOLIN  HFA) 108 (90 Base) MCG/ACT inhaler 2 puff (has no administration in time range)  oseltamivir  (TAMIFLU ) capsule 30 mg (30 mg Oral Given 01/30/24 0928)  methylPREDNISolone  sodium succinate (SOLU-MEDROL ) 40 mg/mL injection 40 mg (40 mg Intravenous Given 01/30/24 0320)  dextromethorphan -guaiFENesin  (MUCINEX  DM) 30-600 MG per 12 hr tablet 1 tablet (1 tablet Oral Given 01/30/24 0928)  hydrALAZINE  (APRESOLINE ) injection 10 mg (10 mg Intravenous Given 01/30/24 0325)  allopurinol  (ZYLOPRIM ) tablet 100 mg (100 mg Oral Given 01/30/24 0928)  cholecalciferol  (VITAMIN D3) 25 MCG (1000 UNIT) tablet 1,000 Units (1,000 Units Oral Given 01/30/24 0928)  sodium chloride  flush (NS) 0.9 % injection 3 mL (3 mLs Intravenous Given 01/30/24 0942)  sodium chloride  flush (NS) 0.9 % injection 3 mL (3 mLs Intravenous Given 01/30/24 0942)  sodium chloride  flush (NS) 0.9 % injection 3 mL (has no administration in time range)  0.9 %  sodium chloride  infusion (has no administration in time range)  acetaminophen  (TYLENOL ) tablet 650 mg (has no administration in time range)    Or  acetaminophen  (TYLENOL ) suppository 650 mg (has no administration in time range)  traZODone  (DESYREL ) tablet 50 mg (has no administration in time range)  polyethylene glycol (MIRALAX  / GLYCOLAX ) packet 17 g  (has no administration in time range)  bisacodyl  (DULCOLAX) suppository 10 mg (has no administration in time range)  ondansetron  (ZOFRAN ) tablet 4 mg (has no administration in time range)    Or  ondansetron  (ZOFRAN ) injection 4 mg (has no administration in time range)  heparin  injection 5,000 Units (5,000 Units Subcutaneous Given 01/30/24 0603)  ipratropium-albuterol  (DUONEB) 0.5-2.5 (3) MG/3ML nebulizer solution 3 mL (3 mLs Nebulization Given 01/30/24 0744)  amLODipine  (NORVASC ) tablet 10 mg (10 mg Oral Given 01/30/24 1031)    ED Course/ Medical Decision Making/ A&P Clinical Course as of 01/30/24 1246  Wed Jan 29, 2024  1349 Dr Pearlean from hospitalist to admit [RP]    Clinical Course User Index [RP]  Yolande Lamar BROCKS, MD                                 Medical Decision Making Amount and/or Complexity of Data Reviewed Labs: ordered. Radiology: ordered.  Risk Prescription drug management. Decision regarding hospitalization.   Kristi Grant is a 89 y.o. female with comorbidities that complicate the patient evaluation including  hypertension and hyperlipidemia who presents emergency department with cough and generalized fatigue.   Initial Ddx:  URI, pneumonia, COPD exacerbation, CHF exacerbation, PE  MDM/Course:  Patient presents to the emergency department with URI symptoms and generalized fatigue.  Was hypoxic on arrival.  Went to urgent care and already received a DuoNeb.  Does have a remote history of smoking but this was for very brief time while she was in college so do not feel that there is enough exposure to cause her to have COPD.  On arrival she is overall well-appearing but does have any 3 L oxygen  requirement.  I do not appreciate any significant wheezing.  She was found to be flu positive.  Chest x-ray without signs of underlying pneumonia.  Upon re-evaluation was stable.  Admitted to hospitalist for management of influenza.  Started on Tamiflu .  This patient presents to  the ED for concern of complaints listed in HPI, this involves an extensive number of treatment options, and is a complaint that carries with it a high risk of complications and morbidity. Disposition including potential need for admission considered.   Dispo: Admit to Floor  Additional history obtained from son Records reviewed Outpatient Clinic Notes The following labs were independently interpreted: Chemistry and show no acute abnormality I independently reviewed the following imaging with scope of interpretation limited to determining acute life threatening conditions related to emergency care: Chest x-ray and agree with the radiologist interpretation with the following exceptions: none I personally reviewed and interpreted cardiac monitoring: normal sinus rhythm  I personally reviewed and interpreted the pt's EKG: see above for interpretation  I have reviewed the patients home medications and made adjustments as needed Consults: Hospitalist Social Determinants of health:  Elderly  Portions of this note were generated with Scientist, clinical (histocompatibility and immunogenetics). Dictation errors may occur despite best attempts at proofreading.     Final Clinical Impression(s) / ED Diagnoses Final diagnoses:  Hypoxia  Influenza A    Rx / DC Orders ED Discharge Orders     None         Yolande Lamar BROCKS, MD 01/30/24 1246

## 2024-01-29 NOTE — H&P (Signed)
 Patient Demographics:    Kristi Grant, is a 89 y.o. female  MRN: [id number]   DOB - 12/16/1934  Admit Date - 01/29/2024  Outpatient Primary MD for the patient is Alphonsa Glendia LABOR, MD   Assessment & Plan:   Assessment and Plan: 1) hypoxic respiratory failure due to influenza A virus -- Patient has been weaned down to 2 L of oxygen  from 15 L -Tamiflu , bronchodilators, IV Solu-Medrol  and mucolytics as ordered  2)HTN--restart amlodipine  IV hydralazine  as needed elevated BP  3) gout--continue allopurinol  for prophylaxis  4) HLD--hold pravastatin  as patient has myalgia from influenza infection   Dispo: The patient is from: Home              Anticipated d/c is to: Home              Anticipated d/c date is: 1 day              Patient currently is not medically stable to d/c. Barriers: Not Clinically Stable-   With History of - Reviewed by me  Past Medical History:  Diagnosis Date   Arthritis    Blood transfusion without reported diagnosis    Cataract    Hypertension    Neuromuscular disorder [facility])       Past Surgical History:  Procedure Laterality Date   ABDOMINAL HYSTERECTOMY     BASAL CELL CARCINOMA EXCISION  01/2011   nose   CATARACT EXTRACTION     Ovarian wedge resection  1962   REPLACEMENT TOTAL KNEE Left 05/2010   VAGINAL HYSTERECTOMY  1990    Chief Complaint  Patient presents with   Shortness of Breath      HPI:    Kristi Grant  is a 89 y.o. female with past medical history relevant for hypertension and dyslipidemia presents to the ED with worsening shortness of breath and was found to be hypoxic with oxygen  saturation of 84% on room air -- After interventions in the ED O2 sats improved to 86% on room air patient required up to 15 L of oxygen  initially -Currently weaned down to 3 L of  oxygen  -Patient reports cough congestion hoarseness, since 01/14/2024 -Reports low-grade fevers and chills, -As well as dyspnea -Patient initially presented to urgent care on 01/29/2024 with above complaints because of hypoxia she was advised to go to the ER for higher level of care -- In the ED chest x-ray without acute findings -COVID and RSV negative, influenza A+ -Troponin is not elevated, BNP is not elevated -WBC 3.0, hemoglobin 11.1 which is not far from baseline platelets 171 -Sodium is 134, creatinine 0.75  Review of systems:    In addition to the HPI above,   A full Review of  Systems was done, all other systems reviewed are negative except as noted above in HPI , .    Social History:  Reviewed by me    Social History   Tobacco Use  Smoking status: Never   Smokeless tobacco: Never  Substance Use Topics   Alcohol use: No     Family History :  Reviewed by me    Family History  Problem Relation Age of Onset   Heart disease Mother    Heart disease Father    Breast cancer Sister    Cirrhosis Sister    Diabetes Sister    Breast cancer Maternal Grandmother    Ovarian cancer Maternal Grandmother       Home Medications:   Prior to Admission medications   Medication Sig Start Date End Date Taking? Authorizing Provider  amLODipine  (NORVASC ) 2.5 MG tablet Take 1 tablet (2.5 mg total) by mouth daily. 10/14/23  Yes Luking, Glendia LABOR, MD  cholecalciferol  (VITAMIN D3) 25 MCG (1000 UNIT) tablet Take 1,000 Units by mouth daily.   Yes [provider]  Coenzyme Q10 (COQ-10) 100 MG CAPS Take by mouth daily.   Yes [provider]  Methylcobalamin (B12-ACTIVE PO) Take by mouth.   Yes [provider]  pravastatin  (PRAVACHOL ) 20 MG tablet Take 1 tablet (20 mg total) by mouth daily. 10/14/23  Yes Alphonsa Glendia LABOR, MD  allopurinol  (ZYLOPRIM ) 100 MG tablet Take 1 tablet (100 mg total) by mouth daily. Patient not taking: Reported on 01/29/2024 10/14/23   Alphonsa Glendia LABOR, MD     Allergies:     Allergies  Allergen Reactions   Clindamycin/Lincomycin Swelling   Ciprofloxacin  Rash    Hives 5 days after   Flagyl  [Metronidazole ]     Hives 5 days after   Hydrocodone     hallucinations   Iohexol      Code: HIVES, Desc: Pt. given 19ml Multihance for MRI scan of liver.  Pt. presented with multiple hives immediately post exam.  Pt. was seen by Radiologist who proscribed 50mg  PO Benadryl.  Pt. observed post Benadryl for 30 min. before Rad. released her., Onset Date: [id number]    Nucynta [Tapentadol]     hallucinations   Rocephin  [Ceftriaxone  Sodium In Dextrose] Hives    Delayed same day-can take amoxil      Physical Exam:   Vitals  Blood pressure (!) 178/84, pulse 88, temperature 99.2 F (37.3 C), temperature source Oral, resp. rate (!) 31, height 5' 7 (1.702 m), weight 76.9 kg, SpO2 92%.  Physical Examination: General appearance - alert,  in no distress  Mental status - alert, oriented to person, place, and time,  Nose- [employer] 3L/min Eyes - sclera anicteric Neck - supple, no JVD elevation , Chest -diminished breath sounds with scattered wheezes bilaterally  heart - S1 and S2 normal, regular  Abdomen - soft, nontender, nondistended, +BS Neurological - screening mental status exam normal, neck supple without rigidity, cranial nerves II through XII intact, DTR's normal and symmetric Extremities - no pedal edema noted, intact peripheral pulses  Skin - warm, dry     Data Review:    CBC Recent Labs  Lab 01/29/24 1254  WBC 3.0*  HGB 11.1*  HCT 32.9*  PLT 171  MCV 91.9  MCH 31.0  MCHC 33.7  RDW 14.2  LYMPHSABS 1.5  MONOABS 0.4  EOSABS 0.0  BASOSABS 0.0   ------------------------------------------------------------------------------------------------------------------  Chemistries  Recent Labs  Lab 01/29/24 1254  NA 134*  K 3.8  CL 97*  CO2 27  GLUCOSE 109*  BUN 16  CREATININE 0.75  CALCIUM 9.3    ------------------------------------------------------------------------------------------------------------------ estimated creatinine clearance is 52.9 mL/min (by C-G formula based on SCr of 0.75 mg/dL). ------------------------------------------------------------------------------------------------------------------ ------------------------------------------------------------------------------------------------------------------  Component Value Date/Time   BNP 38.0 01/29/2024 1254   Urinalysis    Component Value Date/Time   COLORURINE YELLOW 06/16/2010 0910   APPEARANCEUR CLEAR 06/16/2010 0910   LABSPEC 1.016 06/16/2010 0910   PHURINE 7.0 06/16/2010 0910   GLUCOSEU NEGATIVE 06/16/2010 0910   HGBUR NEGATIVE 06/16/2010 0910   BILIRUBINUR negative 05/21/2023 1143   KETONESUR trace (5) (A) 05/21/2023 1143   KETONESUR NEGATIVE 06/16/2010 0910   PROTEINUR =30 (A) 05/21/2023 1143   PROTEINUR Positive (A) 12/09/2018 0852   PROTEINUR NEGATIVE 06/16/2010 0910   UROBILINOGEN 2.0 (A) 05/21/2023 1143   UROBILINOGEN 0.2 06/16/2010 0910   NITRITE Positive (A) 05/21/2023 1143   NITRITE + 12/09/2018 0852   NITRITE NEGATIVE 06/16/2010 0910   LEUKOCYTESUR Large (3+) (A) 05/21/2023 1143    Imaging Results:    DG Chest 2 View Result Date: 01/29/2024 CLINICAL DATA:  Shortness of breath. EXAM: CHEST - 2 VIEW COMPARISON:  Chest radiograph dated 06/16/2010. FINDINGS: No focal consolidation, pleural effusion, or pneumothorax. The cardiac silhouette is within normal limits. Atherosclerotic calcification of the aorta. No acute osseous pathology. IMPRESSION: No active cardiopulmonary disease. Electronically Signed   By: Vanetta Chou M.D.   On: 01/29/2024 14:46    Radiological Exams on Admission: DG Chest 2 View Result Date: 01/29/2024 CLINICAL DATA:  Shortness of breath. EXAM: CHEST - 2 VIEW COMPARISON:  Chest radiograph dated 06/16/2010. FINDINGS: No focal consolidation, pleural effusion, or  pneumothorax. The cardiac silhouette is within normal limits. Atherosclerotic calcification of the aorta. No acute osseous pathology. IMPRESSION: No active cardiopulmonary disease. Electronically Signed   By: Vanetta Chou M.D.   On: 01/29/2024 14:46    DVT Prophylaxis -SCD/heparin  subcu AM Labs Ordered, also please review Full Orders  Family Communication: Admission, patients condition and plan of care including tests being ordered have been discussed with the patient  who indicate understanding and agree with the plan   Condition  stable  Rendall Carwin M.D on 01/29/2024 at 6:52 PM Go to [email or web address] -  for contact info  [facility] - Office  [phone]

## 2024-01-29 NOTE — ED Triage Notes (Signed)
 Pt reports cough, congestion, hoarseness, pt states she has had cough since friday but it has seemingly gotten worse. Pt is unable to take a deep breath without coughing.

## 2024-01-29 NOTE — ED Triage Notes (Signed)
 Pt arrived via POV c/o SOB. Pt sent to ER for further treatment and evaluation from Urgent Care. Pt's O2 Sats were 84% room air. Pt received a Neb Treatment before being sent to the ER. Pt presents at 86% O2 Sats on room air. 4L Nasal Cannula improved to 88%. Pt placed on 15L NRB and O2 Sats improved to 91%.

## 2024-01-29 NOTE — ED Notes (Addendum)
 Consulted NP. Pt placed on 2 liters [place] while son in restroom prior to departure from UC to [facility] ED.

## 2024-01-30 ENCOUNTER — Ambulatory Visit: Payer: Self-pay

## 2024-01-30 DIAGNOSIS — Z79899 Other long term (current) drug therapy: Secondary | ICD-10-CM | POA: Diagnosis not present

## 2024-01-30 DIAGNOSIS — Z881 Allergy status to other antibiotic agents status: Secondary | ICD-10-CM | POA: Diagnosis not present

## 2024-01-30 DIAGNOSIS — Z85828 Personal history of other malignant neoplasm of skin: Secondary | ICD-10-CM | POA: Diagnosis not present

## 2024-01-30 DIAGNOSIS — Z96652 Presence of left artificial knee joint: Secondary | ICD-10-CM | POA: Diagnosis present

## 2024-01-30 DIAGNOSIS — Z8249 Family history of ischemic heart disease and other diseases of the circulatory system: Secondary | ICD-10-CM | POA: Diagnosis not present

## 2024-01-30 DIAGNOSIS — J101 Influenza due to other identified influenza virus with other respiratory manifestations: Secondary | ICD-10-CM | POA: Diagnosis present

## 2024-01-30 DIAGNOSIS — I10 Essential (primary) hypertension: Secondary | ICD-10-CM | POA: Diagnosis present

## 2024-01-30 DIAGNOSIS — J449 Chronic obstructive pulmonary disease, unspecified: Secondary | ICD-10-CM | POA: Diagnosis present

## 2024-01-30 DIAGNOSIS — Z90710 Acquired absence of both cervix and uterus: Secondary | ICD-10-CM | POA: Diagnosis not present

## 2024-01-30 DIAGNOSIS — Z833 Family history of diabetes mellitus: Secondary | ICD-10-CM | POA: Diagnosis not present

## 2024-01-30 DIAGNOSIS — E785 Hyperlipidemia, unspecified: Secondary | ICD-10-CM | POA: Diagnosis present

## 2024-01-30 DIAGNOSIS — Z885 Allergy status to narcotic agent status: Secondary | ICD-10-CM | POA: Diagnosis not present

## 2024-01-30 DIAGNOSIS — Z883 Allergy status to other anti-infective agents status: Secondary | ICD-10-CM | POA: Diagnosis not present

## 2024-01-30 DIAGNOSIS — Z888 Allergy status to other drugs, medicaments and biological substances status: Secondary | ICD-10-CM | POA: Diagnosis not present

## 2024-01-30 DIAGNOSIS — Z1152 Encounter for screening for COVID-19: Secondary | ICD-10-CM | POA: Diagnosis not present

## 2024-01-30 DIAGNOSIS — J9601 Acute respiratory failure with hypoxia: Secondary | ICD-10-CM | POA: Diagnosis present

## 2024-01-30 DIAGNOSIS — N3281 Overactive bladder: Secondary | ICD-10-CM | POA: Diagnosis present

## 2024-01-30 MED ORDER — AMLODIPINE BESYLATE 5 MG PO TABS
10.0000 mg | ORAL_TABLET | Freq: Every day | ORAL | Status: DC
Start: 2024-01-30 — End: 2024-02-03
  Administered 2024-01-30 – 2024-02-02 (×4): 10 mg via ORAL
  Filled 2024-01-30 (×4): qty 2

## 2024-01-30 NOTE — Progress Notes (Signed)
 Mobility Specialist Progress Note:    01/30/24 0930  Mobility  Activity Ambulated with assistance in hallway;Ambulated with assistance to bathroom  Level of Assistance Modified independent, requires aide device or extra time  Assistive Device None  Distance Ambulated (ft) 70 ft  Range of Motion/Exercises Active;All extremities  Activity Response Tolerated well  Mobility Referral Yes  Mobility visit 1 Mobility  Mobility Specialist Start Time (ACUTE ONLY) 0900  Mobility Specialist Stop Time (ACUTE ONLY) 0925  Mobility Specialist Time Calculation (min) (ACUTE ONLY) 25 min   Pt received in bed, agreeable to mobility. ModI to stand and ambulate with no AD. Tolerated well, see O2 sats below. Pt c/o SOB during session. Returned to room, left pt sitting EOB. Son at bedside, all needs met.   SpO2 90% on RA at rest SpO2 83% on RA during ambulation SpO2 92% on 3L during ambulation  Sherrilee Ditty Mobility Specialist Please contact via SecureChat or  Rehab office at [phone]

## 2024-01-30 NOTE — TOC CM/SW Note (Signed)
 Transition of Care [facility]) - Inpatient Brief Assessment   Patient Details  Name: Kristi Grant MRN: [id number] Date of Birth: 15-Jul-1934  Transition of Care [facility]) CM/SW Contact:    Lucie Lunger, LCSWA Phone Number: 01/30/2024, 12:09 PM   Clinical Narrative: Tranisition of Care Department [id number]) has reviewed patient and no TOC needs have been ifentified at this time. We will continue to monitor patient advancement through interdisciplinary progression rounds. If new patient transition needs arise, please place a TOC consult.   Transition of Care Asessment: Insurance and Status: Insurance coverage has been reviewed Patient has primary care physician: Yes Home environment has been reviewed: From home Prior level of function:: Independent Prior/Current Home Services: No current home services Social Drivers of Health Review: SDOH reviewed no interventions necessary Readmission risk has been reviewed: Yes Transition of care needs: no transition of care needs at this time

## 2024-01-30 NOTE — Progress Notes (Signed)
 PROGRESS NOTE  Kristi Grant, is a 89 y.o. female, DOB - 1934-12-14, FMW:[id number]  Admit date - 01/29/2024   Admitting Physician Luevenia Mcavoy Pearlean, MD  Outpatient Primary MD for the patient is Alphonsa Glendia LABOR, MD  LOS - 0  Chief Complaint  Patient presents with   Shortness of Breath      Brief Narrative:   89 y.o. female with past medical history relevant for HTN and dyslipidemia admitted on 2/5//2025 with acute hypoxic respiratory failure secondary to influenza A infection    -Assessment and Plan: 1)Acute Hypoxic respiratory failure due to influenza A virus -- -Attempted to wean patient off O2 she desaturated down to 83% on room air with minimal ambulation -Continue Tamiflu , bronchodilators, IV Solu-Medrol  and mucolytics as ordered   2)HTN--c/n amlodipine   -Use IV hydralazine  as needed elevated BP   3)Gout--continue allopurinol  for prophylaxis   4)HLD--hold pravastatin  as patient has myalgia from influenza infection      Status is: Inpatient   Disposition: The patient is from: Home              Anticipated d/c is to: Home              Anticipated d/c date is: 2 days              Patient currently is not medically stable to d/c. Barriers: Not Clinically Stable-   Code Status :  -  Code Status: Full Code   Family Communication:    (patient is alert, awake and coherent)  Discussed with son  DVT Prophylaxis  :   - SCDs   heparin  injection 5,000 Units Start: 01/29/24 2200 SCDs Start: 01/29/24 1349 Place TED hose Start: 01/29/24 1349   Lab Results  Component Value Date   PLT 171 01/29/2024    Inpatient Medications  Scheduled Meds:  allopurinol   100 mg Oral Daily   amLODipine   10 mg Oral q1800   cholecalciferol   1,000 Units Oral Daily   dextromethorphan -guaiFENesin   1 tablet Oral BID   heparin   5,000 Units Subcutaneous Q8H   ipratropium-albuterol   3 mL Nebulization TID   methylPREDNISolone  (SOLU-MEDROL ) injection  40 mg Intravenous Q12H   oseltamivir   30 mg Oral  BID   sodium chloride  flush  3 mL Intravenous Q12H   sodium chloride  flush  3 mL Intravenous Q12H   Continuous Infusions: PRN Meds:.acetaminophen  **OR** acetaminophen , albuterol , bisacodyl , hydrALAZINE , ondansetron  **OR** ondansetron  (ZOFRAN ) IV, polyethylene glycol, sodium chloride  flush, traZODone    Anti-infectives (From admission, onward)    Start     Dose/Rate Route Frequency Ordered Stop   01/29/24 1400  oseltamivir  (TAMIFLU ) capsule 30 mg        30 mg Oral 2 times daily 01/29/24 1346 02/03/24 0959        Subjective: Kristi Grant today has no fevers, no emesis,  No chest pain,   - Cough dyspnea congestion persist -Oxygen  saturation dropped to 83% with minimal ambulation -Patient's son at bedside, questions answered   Objective: Vitals:   01/30/24 0320 01/30/24 0744 01/30/24 0934 01/30/24 1235  BP: (!) 182/95  (!) 143/89 134/75  Pulse: 84   90  Resp: 20   18  Temp: 98.1 F (36.7 C)   98.7 F (37.1 C)  TempSrc: Oral     SpO2: 91% 91%  (!) 88%  Weight:      Height:        Intake/Output Summary (Last 24 hours) at 01/30/2024 1814 Last data filed at 01/30/2024 1257 Gross per  24 hour  Intake 720 ml  Output --  Net 720 ml   Filed Weights   01/29/24 1217 01/29/24 1848  Weight: 76.9 kg 75.8 kg    Physical Exam Gen:- Awake Alert, dyspnea on exertion, otherwise no acute distress HEENT:- [place].AT, No sclera icterus Nose- [place] 2L/min Neck-Supple Neck,No JVD,.  Lungs-diminished breath sounds, few scattered wheezes  CV- S1, S2 normal, regular  Abd-  +ve B.Sounds, Abd Soft, No tenderness,    Extremity/Skin:- No  edema, pedal pulses present  Psych-affect is appropriate, oriented x3 Neuro-no new focal deficits, no tremors  Data Reviewed: I have personally reviewed following labs and imaging studies  CBC: Recent Labs  Lab 01/29/24 1254  WBC 3.0*  NEUTROABS 1.0*  HGB 11.1*  HCT 32.9*  MCV 91.9  PLT 171   Basic Metabolic Panel: Recent Labs  Lab 01/29/24 1254  NA  134*  K 3.8  CL 97*  CO2 27  GLUCOSE 109*  BUN 16  CREATININE 0.75  CALCIUM 9.3   GFR: Estimated Creatinine Clearance: 52.6 mL/min (by C-G formula based on SCr of 0.75 mg/dL).  Recent Results (from the past 240 hours)  Resp panel by RT-PCR (RSV, Flu A&B, Covid) Anterior Nasal Swab     Status: Abnormal   Collection Time: 01/29/24 12:17 PM   Specimen: Anterior Nasal Swab  Result Value Ref Range Status   SARS Coronavirus 2 by RT PCR NEGATIVE NEGATIVE Final    Comment: (NOTE) SARS-CoV-2 target nucleic acids are NOT DETECTED.  The SARS-CoV-2 RNA is generally detectable in upper respiratory specimens during the acute phase of infection. The lowest concentration of SARS-CoV-2 viral copies this assay can detect is 138 copies/mL. A negative result does not preclude SARS-Cov-2 infection and should not be used as the sole basis for treatment or other patient management decisions. A negative result may occur with  improper specimen collection/handling, submission of specimen other than nasopharyngeal swab, presence of viral mutation(s) within the areas targeted by this assay, and inadequate number of viral copies(<138 copies/mL). A negative result must be combined with clinical observations, patient history, and epidemiological information. The expected result is Negative.  Fact Sheet for Patients:  bloggercourse.com  Fact Sheet for Healthcare Providers:  seriousbroker.it  This test is no t yet approved or cleared by the [place]  FDA and  has been authorized for detection and/or diagnosis of SARS-CoV-2 by FDA under an Emergency Use Authorization (EUA). This EUA will remain  in effect (meaning this test can be used) for the duration of the COVID-19 declaration under Section 564(b)(1) of the Act, 21 U.S.C.section 360bbb-3(b)(1), unless the authorization is terminated  or revoked sooner.       Influenza A by PCR POSITIVE (A)  NEGATIVE Final   Influenza B by PCR NEGATIVE NEGATIVE Final    Comment: (NOTE) The Xpert Xpress SARS-CoV-2/FLU/RSV plus assay is intended as an aid in the diagnosis of influenza from Nasopharyngeal swab specimens and should not be used as a sole basis for treatment. Nasal washings and aspirates are unacceptable for Xpert Xpress SARS-CoV-2/FLU/RSV testing.  Fact Sheet for Patients: bloggercourse.com  Fact Sheet for Healthcare Providers: seriousbroker.it  This test is not yet approved or cleared by the [place]  FDA and has been authorized for detection and/or diagnosis of SARS-CoV-2 by FDA under an Emergency Use Authorization (EUA). This EUA will remain in effect (meaning this test can be used) for the duration of the COVID-19 declaration under Section 564(b)(1) of the Act, 21 U.S.C. section 360bbb-3(b)(1), unless  the authorization is terminated or revoked.     Resp Syncytial Virus by PCR NEGATIVE NEGATIVE Final    Comment: (NOTE) Fact Sheet for Patients: bloggercourse.com  Fact Sheet for Healthcare Providers: seriousbroker.it  This test is not yet approved or cleared by the [place]  FDA and has been authorized for detection and/or diagnosis of SARS-CoV-2 by FDA under an Emergency Use Authorization (EUA). This EUA will remain in effect (meaning this test can be used) for the duration of the COVID-19 declaration under Section 564(b)(1) of the Act, 21 U.S.C. section 360bbb-3(b)(1), unless the authorization is terminated or revoked.  Performed at [facility], [address] [place], [place] [address]     Radiology Studies: DG Chest 2 View Result Date: 01/29/2024 CLINICAL DATA:  Shortness of breath. EXAM: CHEST - 2 VIEW COMPARISON:  Chest radiograph dated 06/16/2010. FINDINGS: No focal consolidation, pleural effusion, or pneumothorax. The cardiac silhouette is within  normal limits. Atherosclerotic calcification of the aorta. No acute osseous pathology. IMPRESSION: No active cardiopulmonary disease. Electronically Signed   By: Vanetta Chou M.D.   On: 01/29/2024 14:46   Scheduled Meds:  allopurinol   100 mg Oral Daily   amLODipine   10 mg Oral q1800   cholecalciferol   1,000 Units Oral Daily   dextromethorphan -guaiFENesin   1 tablet Oral BID   heparin   5,000 Units Subcutaneous Q8H   ipratropium-albuterol   3 mL Nebulization TID   methylPREDNISolone  (SOLU-MEDROL ) injection  40 mg Intravenous Q12H   oseltamivir   30 mg Oral BID   sodium chloride  flush  3 mL Intravenous Q12H   sodium chloride  flush  3 mL Intravenous Q12H   Continuous Infusions:   LOS: 0 days   Rendall Carwin M.D on 01/30/2024 at 6:14 PM  Go to [email or web address] - for contact info  [facility] - Office  [phone]  If 7PM-7AM, please contact night-coverage [email or web address] 01/30/2024, 6:14 PM

## 2024-01-30 NOTE — Plan of Care (Signed)
  Problem: Education: Goal: Knowledge of General Education information will improve Description Including pain rating scale, medication(s)/side effects and non-pharmacologic comfort measures Outcome: Progressing   Problem: Health Behavior/Discharge Planning: Goal: Ability to manage health-related needs will improve Outcome: Progressing   Problem: Clinical Measurements: Goal: Ability to maintain clinical measurements within normal limits will improve Outcome: Progressing Goal: Cardiovascular complication will be avoided Outcome: Progressing   Problem: Activity: Goal: Risk for activity intolerance will decrease Outcome: Progressing   Problem: Nutrition: Goal: Adequate nutrition will be maintained Outcome: Progressing   Problem: Coping: Goal: Level of anxiety will decrease Outcome: Progressing   Problem: Elimination: Goal: Will not experience complications related to bowel motility Outcome: Progressing

## 2024-01-31 DIAGNOSIS — J101 Influenza due to other identified influenza virus with other respiratory manifestations: Secondary | ICD-10-CM | POA: Diagnosis not present

## 2024-01-31 MED ORDER — OXYBUTYNIN CHLORIDE 5 MG PO TABS
5.0000 mg | ORAL_TABLET | Freq: Every day | ORAL | Status: DC
  Administered 2024-01-31 – 2024-02-02 (×3): 5 mg via ORAL
  Filled 2024-01-31 (×3): qty 1

## 2024-01-31 MED ORDER — GUAIFENESIN-DM 100-10 MG/5ML PO SYRP
5.0000 mL | ORAL_SOLUTION | ORAL | Status: DC | PRN
  Administered 2024-02-02: 5 mL via ORAL
  Filled 2024-01-31: qty 5

## 2024-01-31 NOTE — Progress Notes (Signed)
 PROGRESS NOTE  Kristi Grant, is a 89 y.o. female, DOB - 15-Aug-1934, FMW:[id number]  Admit date - 01/29/2024   Admitting Physician Kristi Calamia Pearlean, MD  Outpatient Primary MD for the patient is Kristi Glendia LABOR, MD  LOS - 1  Chief Complaint  Patient presents with   Shortness of Breath      Brief Narrative:   89 y.o. female with past medical history relevant for HTN and dyslipidemia admitted on 2/5//2025 with acute hypoxic respiratory failure secondary to influenza A infection    -Assessment and Plan: 1)Acute Hypoxic respiratory failure due to influenza A virus -01/31/24 -Dyspnea on exertion and hypoxia persist -Desaturated to 83 to 84% on room air with minimal ambulation and was symptomatic -Continue Tamiflu , bronchodilators, IV Solu-Medrol  and mucolytics as ordered   2)HTN--c/n amlodipine   -Use IV hydralazine  as needed elevated BP   3)Gout--continue allopurinol  for prophylaxis   4)HLD--hold pravastatin  as patient has myalgia from influenza infection   5)Overactive  Bladder--trial of oxybutynin    Status is: Inpatient   Disposition: The patient is from: Home              Anticipated d/c is to: Home              Anticipated d/c date is: 1 day              Patient currently is not medically stable to d/c. Barriers: Not Clinically Stable-   Code Status :  -  Code Status: Full Code   Family Communication:    (patient is alert, awake and coherent)  Discussed with son  DVT Prophylaxis  :   - SCDs   heparin  injection 5,000 Units Start: 01/29/24 2200 SCDs Start: 01/29/24 1349 Place TED hose Start: 01/29/24 1349   Lab Results  Component Value Date   PLT 171 01/29/2024   Inpatient Medications  Scheduled Meds:  allopurinol   100 mg Oral Daily   amLODipine   10 mg Oral q1800   cholecalciferol   1,000 Units Oral Daily   dextromethorphan -guaiFENesin   1 tablet Oral BID   heparin   5,000 Units Subcutaneous Q8H   ipratropium-albuterol   3 mL Nebulization TID   methylPREDNISolone   (SOLU-MEDROL ) injection  40 mg Intravenous Q12H   oseltamivir   30 mg Oral BID   oxybutynin   5 mg Oral QHS   sodium chloride  flush  3 mL Intravenous Q12H   sodium chloride  flush  3 mL Intravenous Q12H   Continuous Infusions: PRN Meds:.acetaminophen  **OR** acetaminophen , albuterol , bisacodyl , guaiFENesin -dextromethorphan , hydrALAZINE , ondansetron  **OR** ondansetron  (ZOFRAN ) IV, polyethylene glycol, sodium chloride  flush, traZODone    Anti-infectives (From admission, onward)    Start     Dose/Rate Route Frequency Ordered Stop   01/29/24 1400  oseltamivir  (TAMIFLU ) capsule 30 mg        30 mg Oral 2 times daily 01/29/24 1346 02/03/24 0959        Subjective: [name] today has no fevers, no emesis,  No chest pain,   - -Patient's son at bedside, questions answered -Patient complains of overactive bladder and having to wake up multiple times at night to urinate--- no dysuria no fevers -- Dyspnea on exertion and hypoxia persist--desaturated to 83 to 84% with minimum ambulation   Objective: Vitals:   01/31/24 0456 01/31/24 0832 01/31/24 0853 01/31/24 0900  BP: (!) 189/98  (!) 145/65   Pulse: 91  (!) 104   Resp: 20  (!) 21   Temp: 99.2 F (37.3 C)     TempSrc: Oral     SpO2:  92% 93% (!) 87% 92%  Weight:      Height:        Intake/Output Summary (Last 24 hours) at 01/31/2024 1106 Last data filed at 01/31/2024 0933 Gross per 24 hour  Intake 480 ml  Output --  Net 480 ml   Filed Weights   01/29/24 1217 01/29/24 1848  Weight: 76.9 kg 75.8 kg    Physical Exam Gen:- Awake Alert, dyspnea on exertion,  HEENT:- [place].AT, No sclera icterus Nose- [place] 2L/min Neck-Supple Neck,No JVD,.  Lungs-improving air movement, few scattered wheezes  CV- S1, S2 normal, regular  Abd-  +ve B.Sounds, Abd Soft, No tenderness,    Extremity/Skin:- No  edema, pedal pulses present  Psych-affect is appropriate, oriented x3 Neuro-generalized weakness, no new focal deficits, no tremors  Data Reviewed: I  have personally reviewed following labs and imaging studies  CBC: Recent Labs  Lab 01/29/24 1254  WBC 3.0*  NEUTROABS 1.0*  HGB 11.1*  HCT 32.9*  MCV 91.9  PLT 171   Basic Metabolic Panel: Recent Labs  Lab 01/29/24 1254  NA 134*  K 3.8  CL 97*  CO2 27  GLUCOSE 109*  BUN 16  CREATININE 0.75  CALCIUM 9.3   GFR: Estimated Creatinine Clearance: 52.6 mL/min (by C-G formula based on SCr of 0.75 mg/dL).  Recent Results (from the past 240 hours)  Resp panel by RT-PCR (RSV, Flu A&B, Covid) Anterior Nasal Swab     Status: Abnormal   Collection Time: 01/29/24 12:17 PM   Specimen: Anterior Nasal Swab  Result Value Ref Range Status   SARS Coronavirus 2 by RT PCR NEGATIVE NEGATIVE Final    Comment: (NOTE) SARS-CoV-2 target nucleic acids are NOT DETECTED.  The SARS-CoV-2 RNA is generally detectable in upper respiratory specimens during the acute phase of infection. The lowest concentration of SARS-CoV-2 viral copies this assay can detect is 138 copies/mL. A negative result does not preclude SARS-Cov-2 infection and should not be used as the sole basis for treatment or other patient management decisions. A negative result may occur with  improper specimen collection/handling, submission of specimen other than nasopharyngeal swab, presence of viral mutation(s) within the areas targeted by this assay, and inadequate number of viral copies(<138 copies/mL). A negative result must be combined with clinical observations, patient history, and epidemiological information. The expected result is Negative.  Fact Sheet for Patients:  bloggercourse.com  Fact Sheet for Healthcare Providers:  seriousbroker.it  This test is no t yet approved or cleared by the [place]  FDA and  has been authorized for detection and/or diagnosis of SARS-CoV-2 by FDA under an Emergency Use Authorization (EUA). This EUA will remain  in effect (meaning  this test can be used) for the duration of the COVID-19 declaration under Section 564(b)(1) of the Act, 21 U.S.C.section 360bbb-3(b)(1), unless the authorization is terminated  or revoked sooner.       Influenza A by PCR POSITIVE (A) NEGATIVE Final   Influenza B by PCR NEGATIVE NEGATIVE Final    Comment: (NOTE) The Xpert Xpress SARS-CoV-2/FLU/RSV plus assay is intended as an aid in the diagnosis of influenza from Nasopharyngeal swab specimens and should not be used as a sole basis for treatment. Nasal washings and aspirates are unacceptable for Xpert Xpress SARS-CoV-2/FLU/RSV testing.  Fact Sheet for Patients: bloggercourse.com  Fact Sheet for Healthcare Providers: seriousbroker.it  This test is not yet approved or cleared by the [place]  FDA and has been authorized for detection and/or diagnosis of SARS-CoV-2 by FDA under  an Emergency Use Authorization (EUA). This EUA will remain in effect (meaning this test can be used) for the duration of the COVID-19 declaration under Section 564(b)(1) of the Act, 21 U.S.C. section 360bbb-3(b)(1), unless the authorization is terminated or revoked.     Resp Syncytial Virus by PCR NEGATIVE NEGATIVE Final    Comment: (NOTE) Fact Sheet for Patients: bloggercourse.com  Fact Sheet for Healthcare Providers: seriousbroker.it  This test is not yet approved or cleared by the [place]  FDA and has been authorized for detection and/or diagnosis of SARS-CoV-2 by FDA under an Emergency Use Authorization (EUA). This EUA will remain in effect (meaning this test can be used) for the duration of the COVID-19 declaration under Section 564(b)(1) of the Act, 21 U.S.C. section 360bbb-3(b)(1), unless the authorization is terminated or revoked.  Performed at [facility], [address] [place], [place] [address]     Radiology Studies: DG Chest 2  View Result Date: 01/29/2024 CLINICAL DATA:  Shortness of breath. EXAM: CHEST - 2 VIEW COMPARISON:  Chest radiograph dated 06/16/2010. FINDINGS: No focal consolidation, pleural effusion, or pneumothorax. The cardiac silhouette is within normal limits. Atherosclerotic calcification of the aorta. No acute osseous pathology. IMPRESSION: No active cardiopulmonary disease. Electronically Signed   By: Vanetta Chou M.D.   On: 01/29/2024 14:46   Scheduled Meds:  allopurinol   100 mg Oral Daily   amLODipine   10 mg Oral q1800   cholecalciferol   1,000 Units Oral Daily   dextromethorphan -guaiFENesin   1 tablet Oral BID   heparin   5,000 Units Subcutaneous Q8H   ipratropium-albuterol   3 mL Nebulization TID   methylPREDNISolone  (SOLU-MEDROL ) injection  40 mg Intravenous Q12H   oseltamivir   30 mg Oral BID   oxybutynin   5 mg Oral QHS   sodium chloride  flush  3 mL Intravenous Q12H   sodium chloride  flush  3 mL Intravenous Q12H   Continuous Infusions:   LOS: 1 day   Rendall Carwin M.D on 01/31/2024 at 11:06 AM  Go to [email or web address] - for contact info  [facility] - Office  [phone]  If 7PM-7AM, please contact night-coverage [email or web address] 01/31/2024, 11:06 AM

## 2024-01-31 NOTE — Progress Notes (Signed)
 Pt remained awake off an on throughout the night due to over active bladder, pt remains on 1.5L of O2, sats remain between 88-90 while ambulating.

## 2024-01-31 NOTE — Plan of Care (Signed)
  Problem: Clinical Measurements: Goal: Ability to maintain clinical measurements within normal limits will improve Outcome: Progressing Goal: Will remain free from infection Outcome: Progressing Goal: Diagnostic test results will improve Outcome: Progressing Goal: Respiratory complications will improve Outcome: Progressing Goal: Cardiovascular complication will be avoided Outcome: Progressing   Problem: Elimination: Goal: Will not experience complications related to bowel motility Outcome: Progressing Goal: Will not experience complications related to urinary retention Outcome: Progressing   Problem: Skin Integrity: Goal: Risk for impaired skin integrity will decrease Outcome: Progressing

## 2024-01-31 NOTE — Progress Notes (Signed)
 SATURATION QUALIFICATIONS: (This note is used to comply with regulatory documentation for home oxygen )  Patient Saturations on Room Air at Rest = 87%  Patient Saturations on Room Air while Ambulating = 82%  Patient Saturations on 3 Liters of oxygen  while Ambulating = 90%  Please briefly explain why patient needs home oxygen : Pts o2 dropped below 90 while ambulating

## 2024-02-01 DIAGNOSIS — J101 Influenza due to other identified influenza virus with other respiratory manifestations: Secondary | ICD-10-CM | POA: Diagnosis not present

## 2024-02-01 LAB — BASIC METABOLIC PANEL
Anion gap: 11 (ref 5–15)
BUN: 35 mg/dL — ABNORMAL HIGH (ref 8–23)
CO2: 24 mmol/L (ref 22–32)
Calcium: 9.6 mg/dL (ref 8.9–10.3)
Chloride: 100 mmol/L (ref 98–111)
Creatinine, Ser: 0.88 mg/dL (ref 0.44–1.00)
GFR, Estimated: >60 mL/min (ref >60)
Glucose, Bld: 119 mg/dL — ABNORMAL HIGH (ref 70–99)
Potassium: 3.7 mmol/L (ref 3.5–5.1)
Sodium: 135 mmol/L (ref 135–145)

## 2024-02-01 NOTE — Progress Notes (Signed)
 PROGRESS NOTE  Kristi Grant, is a 89 y.o. female, DOB - [birth year], FMW:[id number]  Admit date - 01/29/2024   Admitting Physician Jashanti Clinkscale Pearlean, MD  Outpatient Primary MD for the patient is Alphonsa Glendia LABOR, MD  LOS - 2  Chief Complaint  Patient presents with   Shortness of Breath      Brief Narrative:   89 y.o. female with past medical history relevant for HTN and dyslipidemia admitted on 2/5//2025 with acute hypoxic respiratory failure secondary to influenza A infection    -Assessment and Plan: 1)Acute Hypoxic respiratory failure due to influenza A virus -02/01/24 -Dyspnea on exertion and hypoxia persist -Desaturated  on room air with minimal ambulation and was symptomatic SpO2 87% on RA at rest SpO2 84% on RA during ambulation SpO2 88-91% on 2L during ambulation SpO2 92% on 2.5L after session -Continue Tamiflu , bronchodilators, IV Solu-Medrol  and mucolytics as ordered   2)HTN--c/n amlodipine   -Use IV hydralazine  as needed elevated BP   3)Gout--continue allopurinol  for prophylaxis   4)HLD--hold pravastatin  as patient has myalgia from influenza infection   5)Overactive  Bladder--trial of oxybutynin    Status is: Inpatient   Disposition: The patient is from: Home              Anticipated d/c is to: Home              Anticipated d/c date is: 1 day              Patient currently is not medically stable to d/c. Barriers: Not Clinically Stable-   Code Status :  -  Code Status: Full Code   Family Communication:    (patient is alert, awake and coherent)  Discussed with son  DVT Prophylaxis  :   - SCDs   heparin  injection 5,000 Units Start: 01/29/24 2200 SCDs Start: 01/29/24 1349 Place TED hose Start: 01/29/24 1349   Lab Results  Component Value Date   PLT 171 01/29/2024   Inpatient Medications  Scheduled Meds:  allopurinol   100 mg Oral Daily   amLODipine   10 mg Oral q1800   cholecalciferol   1,000 Units Oral Daily   dextromethorphan -guaiFENesin   1 tablet Oral BID    heparin   5,000 Units Subcutaneous Q8H   ipratropium-albuterol   3 mL Nebulization TID   methylPREDNISolone  (SOLU-MEDROL ) injection  40 mg Intravenous Q12H   oseltamivir   30 mg Oral BID   oxybutynin   5 mg Oral QHS   sodium chloride  flush  3 mL Intravenous Q12H   sodium chloride  flush  3 mL Intravenous Q12H   Continuous Infusions: PRN Meds:.acetaminophen  **OR** acetaminophen , albuterol , bisacodyl , guaiFENesin -dextromethorphan , hydrALAZINE , ondansetron  **OR** ondansetron  (ZOFRAN ) IV, polyethylene glycol, sodium chloride  flush, traZODone    Anti-infectives (From admission, onward)    Start     Dose/Rate Route Frequency Ordered Stop   01/29/24 1400  oseltamivir  (TAMIFLU ) capsule 30 mg        30 mg Oral 2 times daily 01/29/24 1346 02/03/24 0959        Subjective: Kristi Grant today has no fevers, no emesis,  No chest pain,   - -Dyspnea on exertion and hypoxia persist--desaturated with minimum ambulation SpO2 87% on RA at rest SpO2 84% on RA during ambulation SpO2 88-91% on 2L during ambulation SpO2 92% on 2.5L after session   Objective: Vitals:   02/01/24 0443 02/01/24 0857 02/01/24 1323 02/01/24 1503  BP: (!) 168/90  (!) 144/85   Pulse: 89  84   Resp: 20  18   Temp: 98.4 F (36.9  C)  97.9 F (36.6 C)   TempSrc: Oral  Oral   SpO2: 91% 96% 94% 93%  Weight:      Height:        Intake/Output Summary (Last 24 hours) at 02/01/2024 1835 Last data filed at 02/01/2024 1823 Gross per 24 hour  Intake 483 ml  Output 1100 ml  Net -617 ml   Filed Weights   01/29/24 1217 01/29/24 1848  Weight: 76.9 kg 75.8 kg    Physical Exam Gen:- Awake Alert, dyspnea on exertion,  HEENT:- [place].AT, No sclera icterus Nose- [place] 2-3 L/min Neck-Supple Neck,No JVD,.  Lungs-improving air movement, few scattered wheezes  CV- S1, S2 normal, regular  Abd-  +ve B.Sounds, Abd Soft, No tenderness,    Extremity/Skin:- No  edema, pedal pulses present  Psych-affect is appropriate, oriented  x3 Neuro-generalized weakness, no new focal deficits, no tremors  Data Reviewed: I have personally reviewed following labs and imaging studies  CBC: Recent Labs  Lab 01/29/24 1254  WBC 3.0*  NEUTROABS 1.0*  HGB 11.1*  HCT 32.9*  MCV 91.9  PLT 171   Basic Metabolic Panel: Recent Labs  Lab 01/29/24 1254 02/01/24 0440  NA 134* 135  K 3.8 3.7  CL 97* 100  CO2 27 24  GLUCOSE 109* 119*  BUN 16 35*  CREATININE 0.75 0.88  CALCIUM 9.3 9.6   GFR: Estimated Creatinine Clearance: 47.9 mL/min (by C-G formula based on SCr of 0.88 mg/dL).  Recent Results (from the past 240 hours)  Resp panel by RT-PCR (RSV, Flu A&B, Covid) Anterior Nasal Swab     Status: Abnormal   Collection Time: 01/29/24 12:17 PM   Specimen: Anterior Nasal Swab  Result Value Ref Range Status   SARS Coronavirus 2 by RT PCR NEGATIVE NEGATIVE Final    Comment: (NOTE) SARS-CoV-2 target nucleic acids are NOT DETECTED.  The SARS-CoV-2 RNA is generally detectable in upper respiratory specimens during the acute phase of infection. The lowest concentration of SARS-CoV-2 viral copies this assay can detect is 138 copies/mL. A negative result does not preclude SARS-Cov-2 infection and should not be used as the sole basis for treatment or other patient management decisions. A negative result may occur with  improper specimen collection/handling, submission of specimen other than nasopharyngeal swab, presence of viral mutation(s) within the areas targeted by this assay, and inadequate number of viral copies(<138 copies/mL). A negative result must be combined with clinical observations, patient history, and epidemiological information. The expected result is Negative.  Fact Sheet for Patients:  bloggercourse.com  Fact Sheet for Healthcare Providers:  seriousbroker.it  This test is no t yet approved or cleared by the [place]  FDA and  has been authorized for  detection and/or diagnosis of SARS-CoV-2 by FDA under an Emergency Use Authorization (EUA). This EUA will remain  in effect (meaning this test can be used) for the duration of the COVID-19 declaration under Section 564(b)(1) of the Act, 21 U.S.C.section 360bbb-3(b)(1), unless the authorization is terminated  or revoked sooner.       Influenza A by PCR POSITIVE (A) NEGATIVE Final   Influenza B by PCR NEGATIVE NEGATIVE Final    Comment: (NOTE) The Xpert Xpress SARS-CoV-2/FLU/RSV plus assay is intended as an aid in the diagnosis of influenza from Nasopharyngeal swab specimens and should not be used as a sole basis for treatment. Nasal washings and aspirates are unacceptable for Xpert Xpress SARS-CoV-2/FLU/RSV testing.  Fact Sheet for Patients: bloggercourse.com  Fact Sheet for Healthcare Providers: seriousbroker.it  This test is not yet approved or cleared by the [place]  FDA and has been authorized for detection and/or diagnosis of SARS-CoV-2 by FDA under an Emergency Use Authorization (EUA). This EUA will remain in effect (meaning this test can be used) for the duration of the COVID-19 declaration under Section 564(b)(1) of the Act, 21 U.S.C. section 360bbb-3(b)(1), unless the authorization is terminated or revoked.     Resp Syncytial Virus by PCR NEGATIVE NEGATIVE Final    Comment: (NOTE) Fact Sheet for Patients: bloggercourse.com  Fact Sheet for Healthcare Providers: seriousbroker.it  This test is not yet approved or cleared by the [place]  FDA and has been authorized for detection and/or diagnosis of SARS-CoV-2 by FDA under an Emergency Use Authorization (EUA). This EUA will remain in effect (meaning this test can be used) for the duration of the COVID-19 declaration under Section 564(b)(1) of the Act, 21 U.S.C. section 360bbb-3(b)(1), unless the authorization is  terminated or revoked.  Performed at [facility], [address] [place], [place] [address]     Radiology Studies: No results found.  Scheduled Meds:  allopurinol   100 mg Oral Daily   amLODipine   10 mg Oral q1800   cholecalciferol   1,000 Units Oral Daily   dextromethorphan -guaiFENesin   1 tablet Oral BID   heparin   5,000 Units Subcutaneous Q8H   ipratropium-albuterol   3 mL Nebulization TID   methylPREDNISolone  (SOLU-MEDROL ) injection  40 mg Intravenous Q12H   oseltamivir   30 mg Oral BID   oxybutynin   5 mg Oral QHS   sodium chloride  flush  3 mL Intravenous Q12H   sodium chloride  flush  3 mL Intravenous Q12H   Continuous Infusions:   LOS: 2 days   Rendall Carwin M.D on 02/01/2024 at 6:35 PM  Go to [email or web address] - for contact info  [facility] - Office  [phone]  If 7PM-7AM, please contact night-coverage [email or web address] 02/01/2024, 6:35 PM

## 2024-02-01 NOTE — Progress Notes (Signed)
 Mobility Specialist Progress Note:    02/01/24 0825  Mobility  Activity Ambulated with assistance in hallway;Ambulated with assistance to bathroom;Dangled on edge of bed  Level of Assistance Modified independent, requires aide device or extra time  Assistive Device None  Distance Ambulated (ft) 80 ft  Range of Motion/Exercises Active;All extremities  Activity Response Tolerated well  Mobility Referral Yes  Mobility visit 1 Mobility  Mobility Specialist Start Time (ACUTE ONLY) 0800  Mobility Specialist Stop Time (ACUTE ONLY) 0825  Mobility Specialist Time Calculation (min) (ACUTE ONLY) 25 min   Pt received sitting EOB, eager for mobility. ModI to stand and ambulate with no AD. Tolerated well, see O2 sats below. C/o SOB. Returned to room, left pt sitting EOB. All needs met.   SpO2 87% on RA at rest SpO2 84% on RA during ambulation SpO2 88-91% on 2L during ambulation SpO2 92% on 2.5L after session  Sherrilee Ditty Mobility Specialist Please contact via SecureChat or  Rehab office at [phone]

## 2024-02-01 NOTE — Progress Notes (Signed)
 Mobility Specialist Progress Note:    02/01/24 1128  Mobility  Activity Stood at bedside;Dangled on edge of bed  Level of Assistance Modified independent, requires aide device or extra time  Assistive Device None  Range of Motion/Exercises Active;All extremities  Activity Response Tolerated well  Mobility Referral Yes  Mobility visit 1 Mobility  Mobility Specialist Start Time (ACUTE ONLY) 1100  Mobility Specialist Stop Time (ACUTE ONLY) 1120  Mobility Specialist Time Calculation (min) (ACUTE ONLY) 20 min   Pt received sitting in chair, son at bedside. Eager for mobility, required ModI to stand and ambulate with no AD. Tolerated well, deferred ambulation d/t SpO2 84% on 2.5L after standing. SpO2 88-91% on 3L at rest. Notified RN, left pt sitting EOB, SpO2 90% on 3.5L. All needs met.   Sherrilee Ditty Mobility Specialist Please contact via Special Educational Needs Teacher or  Rehab office at [phone]

## 2024-02-02 DIAGNOSIS — J101 Influenza due to other identified influenza virus with other respiratory manifestations: Secondary | ICD-10-CM | POA: Diagnosis not present

## 2024-02-02 NOTE — Progress Notes (Signed)
SATURATION QUALIFICATIONS: (This note is used to comply with regulatory documentation for home oxygen)  Patient Saturations on Room Air at Rest = 93%  Patient Saturations on Room Air while Ambulating = 85%  Patient Saturations on 3 Liters of oxygen while Ambulating = 92%  Please briefly explain why patient needs home oxygen:

## 2024-02-02 NOTE — Progress Notes (Signed)
 PROGRESS NOTE  Kristi Grant, is a 89 y.o. female, DOB - April 02, 1934, FMW:[id number]  Admit date - 01/29/2024   Admitting Physician Briyan Kleven Pearlean, MD  Outpatient Primary MD for the patient is Alphonsa Glendia LABOR, MD  LOS - 3  Chief Complaint  Patient presents with   Shortness of Breath      Brief Narrative:   89 y.o. female with past medical history relevant for HTN and dyslipidemia admitted on 2/5//2025 with acute hypoxic respiratory failure secondary to influenza A infection  -Possible discharge home on  02/03/2024  May need home O2   -Assessment and Plan: 1)Acute Hypoxic respiratory failure due to influenza A virus -02/02/24 -Dyspnea on exertion and hypoxia persist -Cough improving -Dyspnea on exertion and hypoxia persist--desaturated with minimum ambulation Patient Saturations on Room Air at Rest = 93 %  Patient Saturations on Room Air while Ambulating = 85 %  Patient Saturations on 3 Liters of oxygen  while Ambulating = 92 -Continue Tamiflu , bronchodilators, IV Solu-Medrol  and mucolytics as ordered   2)HTN--c/n amlodipine   -Use IV hydralazine  as needed elevated BP   3)Gout--continue allopurinol  for prophylaxis   4)HLD--hold pravastatin  as patient has myalgia from influenza infection   5)Overactive  Bladder--trial of oxybutynin    Status is: Inpatient   Disposition: The patient is from: Home              Anticipated d/c is to: Home              Anticipated d/c date is: 1 day              Patient currently is not medically stable to d/c. Barriers: Not Clinically Stable-   Code Status :  -  Code Status: Full Code   Family Communication:    (patient is alert, awake and coherent)  Discussed with son  DVT Prophylaxis  :   - SCDs   heparin  injection 5,000 Units Start: 01/29/24 2200 SCDs Start: 01/29/24 1349 Place TED hose Start: 01/29/24 1349   Lab Results  Component Value Date   PLT 171 01/29/2024   Inpatient Medications  Scheduled Meds:  allopurinol   100 mg Oral  Daily   amLODipine   10 mg Oral q1800   cholecalciferol   1,000 Units Oral Daily   dextromethorphan -guaiFENesin   1 tablet Oral BID   heparin   5,000 Units Subcutaneous Q8H   ipratropium-albuterol   3 mL Nebulization TID   methylPREDNISolone  (SOLU-MEDROL ) injection  40 mg Intravenous Q12H   oseltamivir   30 mg Oral BID   oxybutynin   5 mg Oral QHS   sodium chloride  flush  3 mL Intravenous Q12H   sodium chloride  flush  3 mL Intravenous Q12H   Continuous Infusions: PRN Meds:.acetaminophen  **OR** acetaminophen , albuterol , bisacodyl , guaiFENesin -dextromethorphan , hydrALAZINE , ondansetron  **OR** ondansetron  (ZOFRAN ) IV, polyethylene glycol, sodium chloride  flush, traZODone    Anti-infectives (From admission, onward)    Start     Dose/Rate Route Frequency Ordered Stop   01/29/24 1400  oseltamivir  (TAMIFLU ) capsule 30 mg        30 mg Oral 2 times daily 01/29/24 1346 02/03/24 0959        Subjective: [name] today has no fevers, no emesis,  No chest pain,   - -Patient's son at bedside -Cough improving -Dyspnea on exertion and hypoxia persist--desaturated with minimum ambulation Patient Saturations on Room Air at Rest = 93 %  Patient Saturations on Room Air while Ambulating = 85 %  Patient Saturations on 3 Liters of oxygen  while Ambulating = 92   Objective: Vitals:  02/01/24 2047 02/02/24 0852 02/02/24 1318 02/02/24 1457  BP:   (!) 146/71   Pulse:   86   Resp:   18   Temp:   97.9 F (36.6 C)   TempSrc:   Oral   SpO2: 92% 93% 95% 94%  Weight:      Height:        Intake/Output Summary (Last 24 hours) at 02/02/2024 1650 Last data filed at 02/02/2024 1300 Gross per 24 hour  Intake 483 ml  Output 900 ml  Net -417 ml   Filed Weights   01/29/24 1217 01/29/24 1848  Weight: 76.9 kg 75.8 kg    Physical Exam Gen:- Awake Alert, dyspnea on exertion,  HEENT:- [place].AT, No sclera icterus Nose- [place] 3 L/min Neck-Supple Neck,No JVD,.  Lungs-improving air movement, few scattered wheezes   CV- S1, S2 normal, regular  Abd-  +ve B.Sounds, Abd Soft, No tenderness,    Extremity/Skin:- No  edema, pedal pulses present  Psych-affect is appropriate, oriented x3 Neuro-generalized weakness, no new focal deficits, no tremors  Data Reviewed: I have personally reviewed following labs and imaging studies  CBC: Recent Labs  Lab 01/29/24 1254  WBC 3.0*  NEUTROABS 1.0*  HGB 11.1*  HCT 32.9*  MCV 91.9  PLT 171   Basic Metabolic Panel: Recent Labs  Lab 01/29/24 1254 02/01/24 0440  NA 134* 135  K 3.8 3.7  CL 97* 100  CO2 27 24  GLUCOSE 109* 119*  BUN 16 35*  CREATININE 0.75 0.88  CALCIUM 9.3 9.6   GFR: Estimated Creatinine Clearance: 47.9 mL/min (by C-G formula based on SCr of 0.88 mg/dL).  Recent Results (from the past 240 hours)  Resp panel by RT-PCR (RSV, Flu A&B, Covid) Anterior Nasal Swab     Status: Abnormal   Collection Time: 01/29/24 12:17 PM   Specimen: Anterior Nasal Swab  Result Value Ref Range Status   SARS Coronavirus 2 by RT PCR NEGATIVE NEGATIVE Final    Comment: (NOTE) SARS-CoV-2 target nucleic acids are NOT DETECTED.  The SARS-CoV-2 RNA is generally detectable in upper respiratory specimens during the acute phase of infection. The lowest concentration of SARS-CoV-2 viral copies this assay can detect is 138 copies/mL. A negative result does not preclude SARS-Cov-2 infection and should not be used as the sole basis for treatment or other patient management decisions. A negative result may occur with  improper specimen collection/handling, submission of specimen other than nasopharyngeal swab, presence of viral mutation(s) within the areas targeted by this assay, and inadequate number of viral copies(<138 copies/mL). A negative result must be combined with clinical observations, patient history, and epidemiological information. The expected result is Negative.  Fact Sheet for Patients:  bloggercourse.com  Fact Sheet for  Healthcare Providers:  seriousbroker.it  This test is no t yet approved or cleared by the [place]  FDA and  has been authorized for detection and/or diagnosis of SARS-CoV-2 by FDA under an Emergency Use Authorization (EUA). This EUA will remain  in effect (meaning this test can be used) for the duration of the COVID-19 declaration under Section 564(b)(1) of the Act, 21 U.S.C.section 360bbb-3(b)(1), unless the authorization is terminated  or revoked sooner.       Influenza A by PCR POSITIVE (A) NEGATIVE Final   Influenza B by PCR NEGATIVE NEGATIVE Final    Comment: (NOTE) The Xpert Xpress SARS-CoV-2/FLU/RSV plus assay is intended as an aid in the diagnosis of influenza from Nasopharyngeal swab specimens and should not be used as a  sole basis for treatment. Nasal washings and aspirates are unacceptable for Xpert Xpress SARS-CoV-2/FLU/RSV testing.  Fact Sheet for Patients: bloggercourse.com  Fact Sheet for Healthcare Providers: seriousbroker.it  This test is not yet approved or cleared by the [place]  FDA and has been authorized for detection and/or diagnosis of SARS-CoV-2 by FDA under an Emergency Use Authorization (EUA). This EUA will remain in effect (meaning this test can be used) for the duration of the COVID-19 declaration under Section 564(b)(1) of the Act, 21 U.S.C. section 360bbb-3(b)(1), unless the authorization is terminated or revoked.     Resp Syncytial Virus by PCR NEGATIVE NEGATIVE Final    Comment: (NOTE) Fact Sheet for Patients: bloggercourse.com  Fact Sheet for Healthcare Providers: seriousbroker.it  This test is not yet approved or cleared by the [place]  FDA and has been authorized for detection and/or diagnosis of SARS-CoV-2 by FDA under an Emergency Use Authorization (EUA). This EUA will remain in effect (meaning  this test can be used) for the duration of the COVID-19 declaration under Section 564(b)(1) of the Act, 21 U.S.C. section 360bbb-3(b)(1), unless the authorization is terminated or revoked.  Performed at [facility], [address], [place], [place] [address]     Scheduled Meds:  allopurinol   100 mg Oral Daily   amLODipine   10 mg Oral q1800   cholecalciferol   1,000 Units Oral Daily   dextromethorphan -guaiFENesin   1 tablet Oral BID   heparin   5,000 Units Subcutaneous Q8H   ipratropium-albuterol   3 mL Nebulization TID   methylPREDNISolone  (SOLU-MEDROL ) injection  40 mg Intravenous Q12H   oseltamivir   30 mg Oral BID   oxybutynin   5 mg Oral QHS   sodium chloride  flush  3 mL Intravenous Q12H   sodium chloride  flush  3 mL Intravenous Q12H   Continuous Infusions:   LOS: 3 days   Rendall Carwin M.D on 02/02/2024 at 4:50 PM  Go to [email or web address] - for contact info  [facility] - Office  [phone]  If 7PM-7AM, please contact night-coverage [email or web address] 02/02/2024, 4:50 PM

## 2024-02-03 DIAGNOSIS — J101 Influenza due to other identified influenza virus with other respiratory manifestations: Secondary | ICD-10-CM | POA: Diagnosis not present

## 2024-02-03 MED ORDER — ALBUTEROL SULFATE HFA 108 (90 BASE) MCG/ACT IN AERS: 2.0000 | INHALATION_SPRAY | RESPIRATORY_TRACT | 2 refills | Status: DC | PRN

## 2024-02-03 MED ORDER — OXYBUTYNIN CHLORIDE 5 MG PO TABS: 5.0000 mg | ORAL_TABLET | Freq: Every day | ORAL | 2 refills | Status: DC

## 2024-02-03 MED ORDER — ALBUTEROL SULFATE (2.5 MG/3ML) 0.083% IN NEBU: 2.5000 mg | INHALATION_SOLUTION | RESPIRATORY_TRACT | 2 refills | Status: DC | PRN

## 2024-02-03 MED ORDER — PRAVASTATIN SODIUM 20 MG PO TABS: 20.0000 mg | ORAL_TABLET | Freq: Every day | ORAL | 1 refills | Status: DC

## 2024-02-03 MED ORDER — GUAIFENESIN ER 600 MG PO TB12: 600.0000 mg | ORAL_TABLET | Freq: Two times a day (BID) | ORAL | 2 refills | Status: DC

## 2024-02-03 MED ORDER — COMPRESSOR/NEBULIZER MISC: 1.0000 [IU] | Freq: Two times a day (BID) | 0 refills | Status: DC | PRN

## 2024-02-03 MED ORDER — PREDNISONE 20 MG PO TABS: 40.0000 mg | ORAL_TABLET | Freq: Every day | ORAL | 0 refills | Status: AC

## 2024-02-03 MED ORDER — AMLODIPINE BESYLATE 10 MG PO TABS: 10.0000 mg | ORAL_TABLET | Freq: Every day | ORAL | 3 refills | Status: DC

## 2024-02-03 NOTE — Discharge Instructions (Signed)
 1)You need oxygen  at home at 2 L via nasal cannula continuously while awake and while asleep--- smoking or having open fires around oxygen  can cause fire, significant injury and death  2)Follow up with Primary Care Provider Fairy Homer, Jackelyn Marvel, MD) in 3 to 4 weeks for re-evaluation----you may be able to come off oxygen  hopefully in a few weeks

## 2024-02-03 NOTE — Care Management Important Message (Signed)
 Important Message  Patient Details  Name: Kristi Grant MRN: [id number] Date of Birth: [birth year]   Important Message Given:  Yes - Medicare IM     Lotta Frankenfield L Mallary Kreger 02/03/2024, 10:21 AM

## 2024-02-03 NOTE — Progress Notes (Signed)
 Mobility Specialist Progress Note:    02/03/24 1055  Mobility  Activity Ambulated with assistance in hallway  Level of Assistance Modified independent, requires aide device or extra time  Assistive Device None  Distance Ambulated (ft) 150 ft  Range of Motion/Exercises Active;All extremities  Activity Response Tolerated well  Mobility Referral Yes  Mobility visit 1 Mobility  Mobility Specialist Start Time (ACUTE ONLY) 1035  Mobility Specialist Stop Time (ACUTE ONLY) 1055  Mobility Specialist Time Calculation (min) (ACUTE ONLY) 20 min   Pt received in bed, agreeable to mobility. Required ModI to stand and ambulate with no AD. Tolerated well, O2 sats below. Returned pt sitting EOB, all needs met.  SpO2 88% on RA at rest. SpO2 86% on RA during ambulation. SpO2 93% on 2L during ambulation.   Kristi Grant Mobility Specialist Please contact via Special educational needs teacher or  Rehab office at [phone]

## 2024-02-03 NOTE — Discharge Summary (Signed)
Kristi Grant, is a 89 y.o. female  DOB 1934-12-19  MRN [id number].  Admission date:  01/29/2024  Admitting Physician  Shon Hale, MD  Discharge Date:  02/03/2024   Primary MD  Babs Sciara, MD  Recommendations for primary care physician for things to follow:   1)You need oxygen at home at 2 L via nasal cannula continuously while awake and while asleep--- smoking or having open fires around oxygen can cause fire, significant injury and death  2)Follow up with Primary Care Provider Gerda Diss, Jonna Coup, MD) in 3 to 4 weeks for re-evaluation----you may be able to come off oxygen hopefully in a few weeks   Admission Diagnosis  Influenza A [J10.1]   Discharge Diagnosis  Influenza A [J10.1]    Principal Problem:   Influenza A Active Problems:   Acute respiratory failure with hypoxia (HCC)   Essential hypertension, benign      Past Medical History:  Diagnosis Date   Arthritis    Blood transfusion without reported diagnosis    Cataract    Hypertension    Neuromuscular disorder [facility])     Past Surgical History:  Procedure Laterality Date   ABDOMINAL HYSTERECTOMY     BASAL CELL CARCINOMA EXCISION  01/2011   nose   CATARACT EXTRACTION     Ovarian wedge resection  1962   REPLACEMENT TOTAL KNEE Left 05/2010   VAGINAL HYSTERECTOMY  1990     HPI  from the history and physical done on the day of admission:   Kristi Grant  is a 89 y.o. female with past medical history relevant for hypertension and dyslipidemia presents to the ED with worsening shortness of breath and was found to be hypoxic with oxygen saturation of 84% on room air -- After interventions in the ED O2 sats improved to 86% on room air patient required up to 15 L of oxygen initially -Currently weaned down to 3 L of oxygen -Patient reports cough congestion hoarseness, since 01/14/2024 -Reports low-grade fevers and chills, -As well as  dyspnea -Patient initially presented to urgent care on 01/29/2024 with above complaints because of hypoxia she was advised to go to the ER for higher level of care -- In the ED chest x-ray without acute findings -COVID and RSV negative, influenza A+ -Troponin is not elevated, BNP is not elevated -WBC 3.0, hemoglobin 11.1 which is not far from baseline platelets 171 -Sodium is 134, creatinine 0.75    Hospital Course:     Brief Narrative:   89 y.o. female with past medical history relevant for HTN and dyslipidemia admitted on 2/5//2025 with acute hypoxic respiratory failure secondary to influenza A infection     -Assessment and Plan: 1)Acute Hypoxic respiratory failure due to influenza A virus -Dyspnea on exertion and hypoxia improving -Cough improved Patient Saturations on Room Air at Rest = 93 %  Patient Saturations on Room Air while Ambulating = 85 %  Patient Saturations on 3 Liters of oxygen while Ambulating = 92 -treated with Tamiflu, bronchodilators, IV Solu-Medrol and mucolytics  as ordered -Okay to discharge on prednisone  and bronchodilators -Okay to discharge home on oxygen     2)HTN--c/n amlodipine  - 3)Gout--continue allopurinol for prophylaxis   4)HLD--c/n  pravastatin    5)Overactive  Bladder--tolerating oxybutynin well, discharged on same   Disposition: The patient is from: Home              Anticipated d/c is to: Home  Discharge Condition: stable  Follow UP   Follow-up Information     Babs Sciara, MD. Schedule an appointment as soon as possible for a visit in 3 week(s).   Specialty: Family Medicine Contact information: [address] [place] [address] [phone]                Diet and Activity recommendation:  As advised  Discharge Instructions    Discharge Instructions     Call MD for:  difficulty breathing, headache or visual disturbances   Complete by: As directed    Call MD for:  persistant dizziness or  light-headedness   Complete by: As directed    Call MD for:  persistant nausea and vomiting   Complete by: As directed    Call MD for:  temperature >100.4   Complete by: As directed    Diet - low sodium heart healthy   Complete by: As directed    Discharge instructions   Complete by: As directed    1)You need oxygen at home at 2 L via nasal cannula continuously while awake and while asleep--- smoking or having open fires around oxygen can cause fire, significant injury and death  2)Follow up with Primary Care Provider Gerda Diss, Jonna Coup, MD) in 3 to 4 weeks for re-evaluation----you may be able to come off oxygen hopefully in a few weeks   For home use only DME Nebulizer machine   Complete by: As directed    Patient needs a nebulizer to treat with the following condition: Dyspnea and respiratory abnormalities   Length of Need: Lifetime   Additional equipment included:  Administration kit Filter     Increase activity slowly   Complete by: As directed          Discharge Medications     Allergies as of 02/03/2024       Reactions   Clindamycin/lincomycin Swelling   Ciprofloxacin Rash   Hives 5 days after   Flagyl [metronidazole]    Hives 5 days after   Hydrocodone    hallucinations   Iohexol     Code: HIVES, Desc: Pt. given 19ml Multihance for MRI scan of liver.  Pt. presented with multiple hives immediately post exam.  Pt. was seen by Radiologist who proscribed 50mg  PO Benadryl.  Pt. observed post Benadryl for 30 min. before Rad. released her., Onset Date: [id number]   Nucynta [tapentadol]    hallucinations   Rocephin [ceftriaxone Sodium In Dextrose] Hives   Delayed same day-can take amoxil        Medication List     TAKE these medications    albuterol 108 (90 Base) MCG/ACT inhaler Commonly known as: VENTOLIN HFA Inhale 2 puffs into the lungs every 2 (two) hours as needed for wheezing or shortness of breath.   albuterol (2.5 MG/3ML) 0.083% nebulizer  solution Commonly known as: PROVENTIL Take 3 mLs (2.5 mg total) by nebulization every 4 (four) hours as needed for wheezing or shortness of breath.   allopurinol 100 MG tablet Commonly known as: ZYLOPRIM Take 1 tablet (100 mg total)  by mouth daily.   amLODipine 10 MG tablet Commonly known as: NORVASC Take 1 tablet (10 mg total) by mouth daily. What changed:  medication strength how much to take   B12-ACTIVE PO Take by mouth.   cholecalciferol 25 MCG (1000 UNIT) tablet Commonly known as: VITAMIN D3 Take 1,000 Units by mouth daily.   Compressor/Nebulizer Misc 1 Units by Does not apply route 2 (two) times daily as needed.   CoQ-10 100 MG Caps Take by mouth daily.   guaiFENesin 600 MG 12 hr tablet Commonly known as: Mucinex Take 1 tablet (600 mg total) by mouth 2 (two) times daily.   oxybutynin 5 MG tablet Commonly known as: DITROPAN Take 1 tablet (5 mg total) by mouth at bedtime.   pravastatin 20 MG tablet Commonly known as: PRAVACHOL Take 1 tablet (20 mg total) by mouth daily.   predniSONE 20 MG tablet Commonly known as: DELTASONE Take 2 tablets (40 mg total) by mouth daily with breakfast for 5 days.               Durable Medical Equipment  (From admission, onward)           Start     Ordered   02/03/24 1059  For home use only DME oxygen  Once       Comments: SATURATION QUALIFICATIONS: (This note is used to comply with regulatory documentation for home oxygen)   Patient Saturations on Room Air at Rest = 88 %   Patient Saturations on Room Air while Ambulating = 86 %   Patient Saturations on 2 Liters of oxygen while Ambulating = 93 %        Patient needs continuous O2 at 2 L/min continuously via nasal cannula with humidifier, with gaseous portability and conserving device    Dx--COPD  Question Answer Comment  Length of Need Lifetime   Mode or (Route) Nasal cannula   Liters per Minute 2   Frequency Continuous (stationary and portable oxygen  unit needed)   Oxygen conserving device Yes   Oxygen delivery system Gas      02/03/24 1058   02/03/24 0000  For home use only DME Nebulizer machine       Question Answer Comment  Patient needs a nebulizer to treat with the following condition Dyspnea and respiratory abnormalities   Length of Need Lifetime   Additional equipment included Administration kit   Additional equipment included Filter      02/03/24 1319           Major procedures and Radiology Reports - PLEASE review detailed and final reports for all details, in brief -   DG Chest 2 View Result Date: 01/29/2024 CLINICAL DATA:  Shortness of breath. EXAM: CHEST - 2 VIEW COMPARISON:  Chest radiograph dated 06/16/2010. FINDINGS: No focal consolidation, pleural effusion, or pneumothorax. The cardiac silhouette is within normal limits. Atherosclerotic calcification of the aorta. No acute osseous pathology. IMPRESSION: No active cardiopulmonary disease. Electronically Signed   By: Elgie Collard M.D.   On: 01/29/2024 14:46    Micro Results  Recent Results (from the past 240 hours)  Resp panel by RT-PCR (RSV, Flu A&B, Covid) Anterior Nasal Swab     Status: Abnormal   Collection Time: 01/29/24 12:17 PM   Specimen: Anterior Nasal Swab  Result Value Ref Range Status   SARS Coronavirus 2 by RT PCR NEGATIVE NEGATIVE Final    Comment: (NOTE) SARS-CoV-2 target nucleic acids are NOT DETECTED.  The SARS-CoV-2 RNA is generally  detectable in upper respiratory specimens during the acute phase of infection. The lowest concentration of SARS-CoV-2 viral copies this assay can detect is 138 copies/mL. A negative result does not preclude SARS-Cov-2 infection and should not be used as the sole basis for treatment or other patient management decisions. A negative result may occur with  improper specimen collection/handling, submission of specimen other than nasopharyngeal swab, presence of viral mutation(s) within the areas targeted by  this assay, and inadequate number of viral copies(<138 copies/mL). A negative result must be combined with clinical observations, patient history, and epidemiological information. The expected result is Negative.  Fact Sheet for Patients:  BloggerCourse.com  Fact Sheet for Healthcare Providers:  SeriousBroker.it  This test is no t yet approved or cleared by the [place] FDA and  has been authorized for detection and/or diagnosis of SARS-CoV-2 by FDA under an Emergency Use Authorization (EUA). This EUA will remain  in effect (meaning this test can be used) for the duration of the COVID-19 declaration under Section 564(b)(1) of the Act, 21 U.S.C.section 360bbb-3(b)(1), unless the authorization is terminated  or revoked sooner.       Influenza A by PCR POSITIVE (A) NEGATIVE Final   Influenza B by PCR NEGATIVE NEGATIVE Final    Comment: (NOTE) The Xpert Xpress SARS-CoV-2/FLU/RSV plus assay is intended as an aid in the diagnosis of influenza from Nasopharyngeal swab specimens and should not be used as a sole basis for treatment. Nasal washings and aspirates are unacceptable for Xpert Xpress SARS-CoV-2/FLU/RSV testing.  Fact Sheet for Patients: BloggerCourse.com  Fact Sheet for Healthcare Providers: SeriousBroker.it  This test is not yet approved or cleared by the [place] FDA and has been authorized for detection and/or diagnosis of SARS-CoV-2 by FDA under an Emergency Use Authorization (EUA). This EUA will remain in effect (meaning this test can be used) for the duration of the COVID-19 declaration under Section 564(b)(1) of the Act, 21 U.S.C. section 360bbb-3(b)(1), unless the authorization is terminated or revoked.     Resp Syncytial Virus by PCR NEGATIVE NEGATIVE Final    Comment: (NOTE) Fact Sheet for Patients: BloggerCourse.com  Fact  Sheet for Healthcare Providers: SeriousBroker.it  This test is not yet approved or cleared by the [place] FDA and has been authorized for detection and/or diagnosis of SARS-CoV-2 by FDA under an Emergency Use Authorization (EUA). This EUA will remain in effect (meaning this test can be used) for the duration of the COVID-19 declaration under Section 564(b)(1) of the Act, 21 U.S.C. section 360bbb-3(b)(1), unless the authorization is terminated or revoked.  Performed at [facility], [address] [place], [place] [address]    Today   Subjective    Alysse Rathe today has no new complaints  No fever  Or chills  -Dyspnea on exertion improved -Hypoxia improved , but not resolved       Patient Saturations on Room Air at Rest = 93 %  Patient Saturations on Room Air while Ambulating = 85 %  Patient Saturations on 3 Liters of oxygen while Ambulating = 92  Daughter at bedside   Patient has been seen and examined prior to discharge   Objective   Blood pressure (!) 155/84, pulse 81, temperature 98 F (36.7 C), temperature source Oral, resp. rate 19, height 5\' 7"  (1.702 m), weight 75.8 kg, SpO2 95%.   Intake/Output Summary (Last 24 hours) at 02/03/2024 1325 Last data filed at 02/03/2024 0900 Gross per 24 hour  Intake 240 ml  Output --  Net 240 ml    Exam Gen:- Awake Alert, dyspnea on exertion,  HEENT:- [place].AT, No sclera icterus Nose- [place] 2 L/min Neck-Supple Neck,No JVD,.  Lungs-improved air movement, No wheezes  CV- S1, S2 normal, regular  Abd-  +ve B.Sounds, Abd Soft, No tenderness,    Extremity/Skin:- No  edema, pedal pulses present  Psych-affect is appropriate, oriented x3 Neuro-generalized weakness, no new focal deficits, no tremors    Data Review   CBC w Diff:  Lab Results  Component Value Date   WBC 3.0 (L) 01/29/2024   HGB 11.1 (L) 01/29/2024   HGB 11.8 09/03/2022   HCT 32.9 (L) 01/29/2024   HCT 34.8 09/03/2022   PLT 171  01/29/2024   PLT 246 09/03/2022   LYMPHOPCT 50 01/29/2024   MONOPCT 15 01/29/2024   EOSPCT 0 01/29/2024   BASOPCT 0 01/29/2024    CMP:  Lab Results  Component Value Date   NA 135 02/01/2024   NA 141 10/10/2023   K 3.7 02/01/2024   CL 100 02/01/2024   CO2 24 02/01/2024   BUN 35 (H) 02/01/2024   BUN 20 10/10/2023   CREATININE 0.88 02/01/2024   CREATININE 0.93 01/21/2015   PROT 7.3 04/17/2023   ALBUMIN 4.7 04/17/2023   BILITOT 0.4 04/17/2023   ALKPHOS 73 04/17/2023   AST 20 04/17/2023   ALT 12 04/17/2023  .  Total Discharge time is about 33 minutes  Shon Hale M.D on 02/03/2024 at 1:25 PM  Go to [email or web address] -  for contact info  [facility] - Office  [phone]

## 2024-02-03 NOTE — TOC Transition Note (Addendum)
 Transition of Care [facility]) - Discharge Note   Patient Details  Name: Kristi Grant MRN: [id number] Date of Birth: 06-26-1934  Transition of Care [facility]) CM/SW Contact:  Grandville Lax, LCSWA Phone Number: 02/03/2024, 11:21 AM   Clinical Narrative:    CSW updated by MD that pt will need home O2 set up prior to D/C. CSW spoke with pt about agency preference, pt states she does not have one. CSW spoke to Thayer with [employer] who states that they accept and will work on getting O2 set up for pt. CSW updated that pt will need neb machine as well, referred to [employer] along with O2. TOC signing off.   Final next level of care: Home/Self Care Barriers to Discharge: Barriers Resolved   Patient Goals and CMS Choice Patient states their goals for this hospitalization and ongoing recovery are:: return home CMS Medicare.gov Compare Post Acute Care list provided to:: Patient Choice offered to / list presented to : Patient      Discharge Placement                       Discharge Plan and Services Additional resources added to the After Visit Summary for                  DME Arranged: Oxygen  DME Agency: [employer] Date DME Agency Contacted: 02/03/24   Representative spoke with at DME Agency: Gladys Lamp            Social Drivers of Health (SDOH) Interventions SDOH Screenings   Food Insecurity: No Food Insecurity (01/29/2024)  Housing: Low Risk  (01/29/2024)  Transportation Needs: No Transportation Needs (01/29/2024)  Utilities: Not At Risk (01/29/2024)  Alcohol Screen: Low Risk  (07/12/2023)  Depression (PHQ2-9): Low Risk  (10/14/2023)  Financial Resource Strain: Low Risk  (07/12/2023)  Physical Activity: Sufficiently Active (07/12/2023)  Social Connections: Moderately Integrated (01/29/2024)  Stress: No Stress Concern Present (07/12/2023)  Tobacco Use: Low Risk  (01/29/2024)  Health Literacy: Adequate Health Literacy (07/12/2023)     Readmission Risk Interventions     No data to display

## 2024-02-03 NOTE — Progress Notes (Signed)
 Mobility Specialist Progress Note:    02/03/24 0945  Mobility  Activity Ambulated with assistance in hallway  Level of Assistance Modified independent, requires aide device or extra time  Assistive Device None  Distance Ambulated (ft) 140 ft  Range of Motion/Exercises Active;All extremities  Activity Response Tolerated well  Mobility Referral Yes  Mobility visit 1 Mobility  Mobility Specialist Start Time (ACUTE ONLY) 0945  Mobility Specialist Stop Time (ACUTE ONLY) 1005  Mobility Specialist Time Calculation (min) (ACUTE ONLY) 20 min   Pt received in bed agreeable to mobility, Required ModI to stand and ambulate with no AD. Tolerated well, SpO2 95% on 3L before and after ambulation. Returned pt sitting EOB, all needs met.  Atisha Hamidi Mobility Specialist Please contact via Special educational needs teacher or  Rehab office at [phone]

## 2024-02-03 NOTE — Progress Notes (Signed)
    SATURATION QUALIFICATIONS: (This note is used to comply with regulatory documentation for home oxygen )   Patient Saturations on Room Air at Rest = 88 %   Patient Saturations on Room Air while Ambulating = 86 %   Patient Saturations on 2 Liters of oxygen  while Ambulating = 93 %        Patient needs continuous O2 at 2 L/min continuously via nasal cannula with humidifier, with gaseous portability and conserving device    Dx--COPD Kristi Dawley, MD

## 2024-02-04 ENCOUNTER — Telehealth: Payer: Self-pay

## 2024-02-04 DIAGNOSIS — J9601 Acute respiratory failure with hypoxia: Secondary | ICD-10-CM | POA: Diagnosis not present

## 2024-02-04 DIAGNOSIS — J449 Chronic obstructive pulmonary disease, unspecified: Secondary | ICD-10-CM | POA: Diagnosis not present

## 2024-02-04 NOTE — Transitions of Care (Post Inpatient/ED Visit) (Signed)
02/04/2024  Name: Kristi Grant MRN: [id number] DOB: [birth year]  Today's TOC FU Call Status: Today's TOC FU Call Status:: Successful TOC FU Call Completed TOC FU Call Complete Date: 02/04/24 Patient's Name and Date of Birth confirmed.  Transition Care Management Follow-up Telephone Call Date of Discharge: 02/03/24 [facility] Type of Discharge: Inpatient Admission Primary Inpatient Discharge Diagnosis:: influenza How have you been since you were released from the hospital?: Better Any questions or concerns?: No  Items Reviewed: Did you receive and understand the discharge instructions provided?: Yes Medications obtained,verified, and reconciled?: Yes (Medications Reviewed) Any new allergies since your discharge?: No Dietary orders reviewed?: Yes Do you have support at home?: Yes People in Home: other relative(s)  Medications Reviewed Today: Medications Reviewed Today     Reviewed by Karena Addison, LPN (Licensed Practical Nurse) on 02/04/24 at 1001  Med List Status: <None>   Medication Order Taking? Sig Documenting Provider Last Dose Status Informant  albuterol (PROVENTIL) (2.5 MG/3ML) 0.083% nebulizer solution [id number] Yes Take 3 mLs (2.5 mg total) by nebulization every 4 (four) hours as needed for wheezing or shortness of breath. Shon Hale, MD Taking Active   albuterol (VENTOLIN HFA) 108 (90 Base) MCG/ACT inhaler [id number] Yes Inhale 2 puffs into the lungs every 2 (two) hours as needed for wheezing or shortness of breath. Shon Hale, MD Taking Active   allopurinol (ZYLOPRIM) 100 MG tablet [id number] Yes Take 1 tablet (100 mg total) by mouth daily. Babs Sciara, MD Taking Active Self, Pharmacy Records  amLODipine (NORVASC) 10 MG tablet [id number] Yes Take 1 tablet (10 mg total) by mouth daily. Shon Hale, MD Taking Active   cholecalciferol (VITAMIN D3) 25 MCG (1000 UNIT) tablet [id number] Yes Take 1,000 Units by mouth daily.  [provider] Taking Active Self, Pharmacy Records  Coenzyme Q10 (COQ-10) 100 MG CAPS [id number] Yes Take by mouth daily. [provider] Taking Active Self, Pharmacy Records  guaiFENesin (MUCINEX) 600 MG 12 hr tablet [id number] Yes Take 1 tablet (600 mg total) by mouth 2 (two) times daily. Shon Hale, MD Taking Active   Methylcobalamin (B12-ACTIVE PO) [id number] Yes Take by mouth. [provider] Taking Active Self, Pharmacy Records  Nebulizers (COMPRESSOR/NEBULIZER) MISC [id number] Yes 1 Units by Does not apply route 2 (two) times daily as needed. Shon Hale, MD Taking Active   oxybutynin (DITROPAN) 5 MG tablet [id number] Yes Take 1 tablet (5 mg total) by mouth at bedtime. Shon Hale, MD Taking Active   pravastatin (PRAVACHOL) 20 MG tablet [id number] Yes Take 1 tablet (20 mg total) by mouth daily. Shon Hale, MD Taking Active   predniSONE (DELTASONE) 20 MG tablet [id number] Yes Take 2 tablets (40 mg total) by mouth daily with breakfast for 5 days. Shon Hale, MD Taking Active             Home Care and Equipment/Supplies: Were Home Health Services Ordered?: NA Any new equipment or medical supplies ordered?: Yes Name of Medical supply agency?: [facility] Were you able to get the equipment/medical supplies?: Yes Do you have any questions related to the use of the equipment/supplies?: No  Functional Questionnaire: Do you need assistance with bathing/showering or dressing?: No Do you need assistance with meal preparation?: No Do you need assistance with eating?: No Do you have difficulty maintaining continence: No Do you need assistance with getting out of bed/getting out of a chair/moving?: No Do you have difficulty managing or taking your medications?: No  Follow up appointments reviewed: PCP  Follow-up appointment confirmed?: No (no avail appt, sent message to staff to schedule) MD Provider Line Number:[phone] Given:  No Specialist Hospital Follow-up appointment confirmed?: NA Do you need transportation to your follow-up appointment?: No Do you understand care options if your condition(s) worsen?: Yes-patient verbalized understanding    SIGNATURE Karena Addison, LPN [facility] Nurse Health Advisor Direct Dial [phone]

## 2024-02-04 NOTE — Telephone Encounter (Signed)
Kristi Grant was discharged from the hospital yesterday with influenza and hypoxia  Please talk with her to see how soon she would like to follow-up the hospitalist recommended to follow-up in approximately 2 to 3 weeks but if the patient feels that she is having difficulty I am very flexible please work her into my schedule  My gut feeling says that at the very least we to see her next week with me If she feels she needs to be seen today or tomorrow she can be seen by myself or by Toni Amend and Toni Amend would consult with me thank you

## 2024-02-04 NOTE — Telephone Encounter (Signed)
Patient requested appointment be out 10 days to help her regain her strength. Patient scheduled 02/14/24 at 10:40am with Toni Amend PA

## 2024-02-14 ENCOUNTER — Inpatient Hospital Stay: Payer: Medicare PPO | Admitting: Physician Assistant

## 2024-02-20 ENCOUNTER — Ambulatory Visit: Payer: Medicare PPO | Admitting: Nurse Practitioner

## 2024-02-20 ENCOUNTER — Encounter: Payer: Self-pay | Admitting: Nurse Practitioner

## 2024-02-20 VITALS — BP 208/102 | HR 90 | Temp 98.8°F | Ht 67.0 in | Wt 164.0 lb

## 2024-02-20 DIAGNOSIS — I10 Essential (primary) hypertension: Secondary | ICD-10-CM | POA: Diagnosis not present

## 2024-02-20 DIAGNOSIS — J300 Vasomotor rhinitis: Secondary | ICD-10-CM | POA: Diagnosis not present

## 2024-02-20 DIAGNOSIS — Z09 Encounter for follow-up examination after completed treatment for conditions other than malignant neoplasm: Secondary | ICD-10-CM | POA: Diagnosis not present

## 2024-02-20 DIAGNOSIS — J101 Influenza due to other identified influenza virus with other respiratory manifestations: Secondary | ICD-10-CM

## 2024-02-20 MED ORDER — AMLODIPINE BESYLATE 5 MG PO TABS: 5.0000 mg | ORAL_TABLET | Freq: Every day | ORAL | 1 refills | Status: DC

## 2024-02-20 NOTE — Patient Instructions (Signed)
 Steroid nasal spray such as Flonase Lean head forward Opposite hand, opposite side Light sniff

## 2024-02-20 NOTE — Progress Notes (Signed)
   Subjective:    Patient ID: Kristi Grant, female    DOB: 1934/05/21, 89 y.o.   MRN: [id number]  HPI Presents for recheck after hospitalization for acute respiratory failure related to influenza A.  States she is slowly improving.  Doing much better.  Occasional nonproductive cough mainly at night and first thing in the morning.  No chest pain fever shortness of breath or wheezing.  Has albuterol nebulizer and inhaler on hand but has not had to use it lately.  Patient states her oxygen levels have been 95 to 98% when she checks them.  Would like to the company to come and get the oxygen machine she has at home.  While in the hospital her amlodipine dose was increased to 10 mg daily which patient states made her feel bad and felt the medication dose was too high.  No syncopal episodes.  Has brought her BP log in with her today.  From 2/16 to 2/24 her BP was running 113-129/66-76.  Yesterday her pressure ran 168/85 and 151/64.  Was on amlodipine 2.5 mg long-term before she got sick.  Is back on this dose.  Has been taking fluids well.  Appetite slowly returning.  Has been doing brief activities and then resting.   Review of Systems  Constitutional:  Positive for fatigue. Negative for fever.  HENT:  Positive for congestion, postnasal drip and rhinorrhea. Negative for ear pain and sore throat.   Respiratory:  Positive for cough. Negative for chest tightness, shortness of breath and wheezing.   Cardiovascular:  Negative for chest pain.       Objective:   Physical Exam NAD.  Alert, oriented.  Calm cheerful affect.  Face pink in color.  TMs retracted bilaterally, no erythema.  Pharynx clear and moist.  Neck supple with mild soft anterior cervical adenopathy.  Lungs clear.  Heart regular rate rhythm.  Occasional nonproductive cough noted. Today's Vitals   02/20/24 0838 02/20/24 0915  BP: (!) 169/83 (!) 208/102  Pulse: 90   Temp: 98.8 F (37.1 C)   SpO2: 98%   Weight: 164 lb (74.4 kg)   Height: 5'  7" (1.702 m)    Body mass index is 25.69 kg/m.       Assessment & Plan:   Problem List Items Addressed This Visit       Cardiovascular and Mediastinum   Essential hypertension, benign   Relevant Medications   amLODipine (NORVASC) 5 MG tablet   Uncontrolled hypertension   Relevant Medications   amLODipine (NORVASC) 5 MG tablet   Other Visit Diagnoses       Hospital discharge follow-up    -  Primary     Vasomotor rhinitis          Meds ordered this encounter  Medications   amLODipine (NORVASC) 5 MG tablet    Sig: Take 1 tablet (5 mg total) by mouth daily. For BP    Dispense:  90 tablet    Refill:  1    Supervising Provider:   Lilyan Punt A [[id number]]   Increase amlodipine to 5 mg daily.  Continue to monitor BP outside the office and send results.  Seek help immediately if uncontrolled blood pressure or warning signs. Start Flonase nasal spray as directed OTC. Expect continued gradual resolution of her symptoms.  Follow-up with Dr. Lorin Picket in April as planned, call back sooner if needed.

## 2024-02-24 NOTE — Telephone Encounter (Signed)
 Copied from CRM [id number]. Topic: Clinical - Home Health Verbal Orders >> Feb 24, 2024  1:38 PM Alessandra Bevels wrote: Patient is calling to let Dr. Lorin Picket know that she is calling to make a second request for Dr. Gerda Diss to notify [employer] to pick up the oxygen and tanks. [employer] [facility] is requesting the request to be sent by fax [phone]  Please advise CB- [phone]

## 2024-02-24 NOTE — Telephone Encounter (Signed)
 Thank you :)

## 2024-02-25 NOTE — Telephone Encounter (Signed)
 Order written and staff will fax to [employer] [facility]

## 2024-03-03 DIAGNOSIS — J449 Chronic obstructive pulmonary disease, unspecified: Secondary | ICD-10-CM | POA: Diagnosis not present

## 2024-03-29 ENCOUNTER — Other Ambulatory Visit: Payer: Self-pay | Admitting: Family Medicine

## 2024-03-30 ENCOUNTER — Other Ambulatory Visit: Payer: Self-pay

## 2024-03-30 MED ORDER — PRAVASTATIN SODIUM 20 MG PO TABS: 20.0000 mg | ORAL_TABLET | Freq: Every day | ORAL | 1 refills | Status: DC

## 2024-04-03 DIAGNOSIS — J449 Chronic obstructive pulmonary disease, unspecified: Secondary | ICD-10-CM | POA: Diagnosis not present

## 2024-04-07 ENCOUNTER — Telehealth: Payer: Self-pay

## 2024-04-07 NOTE — Telephone Encounter (Signed)
 Patient has appt next Monday and needs blood work ordered.   Please call patient [phone]

## 2024-04-08 ENCOUNTER — Other Ambulatory Visit: Payer: Self-pay

## 2024-04-08 DIAGNOSIS — H52203 Unspecified astigmatism, bilateral: Secondary | ICD-10-CM | POA: Diagnosis not present

## 2024-04-08 DIAGNOSIS — E7849 Other hyperlipidemia: Secondary | ICD-10-CM

## 2024-04-08 DIAGNOSIS — I10 Essential (primary) hypertension: Secondary | ICD-10-CM | POA: Diagnosis not present

## 2024-04-08 DIAGNOSIS — Z79899 Other long term (current) drug therapy: Secondary | ICD-10-CM

## 2024-04-08 DIAGNOSIS — Z961 Presence of intraocular lens: Secondary | ICD-10-CM | POA: Diagnosis not present

## 2024-04-08 DIAGNOSIS — D649 Anemia, unspecified: Secondary | ICD-10-CM

## 2024-04-08 NOTE — Telephone Encounter (Signed)
 Lipid, liver, metabolic 7, CBC Normochromic anemia, HTN, hyperlipidemia, high risk

## 2024-04-08 NOTE — Telephone Encounter (Signed)
 Informed patient per provider lab orders

## 2024-04-09 ENCOUNTER — Encounter: Payer: Self-pay | Admitting: Family Medicine

## 2024-04-09 LAB — HEPATIC FUNCTION PANEL
ALT: 11 IU/L (ref 0–32)
AST: 20 IU/L (ref 0–40)
Albumin: 4.4 g/dL (ref 3.7–4.7)
Alkaline Phosphatase: 71 IU/L (ref 44–121)
Bilirubin Total: 0.3 mg/dL (ref 0.0–1.2)
Bilirubin, Direct: 0.13 mg/dL (ref 0.00–0.40)
Total Protein: 6.8 g/dL (ref 6.0–8.5)

## 2024-04-09 LAB — LIPID PANEL
Chol/HDL Ratio: 3.2 ratio (ref 0.0–4.4)
Cholesterol, Total: 199 mg/dL (ref 100–199)
HDL: 63 mg/dL (ref >39)
LDL Chol Calc (NIH): 118 mg/dL — ABNORMAL HIGH (ref 0–99)
Triglycerides: 102 mg/dL (ref 0–149)
VLDL Cholesterol Cal: 18 mg/dL (ref 5–40)

## 2024-04-09 LAB — BASIC METABOLIC PANEL WITH GFR
BUN/Creatinine Ratio: 19 (ref 12–28)
BUN: 19 mg/dL (ref 8–27)
CO2: 26 mmol/L (ref 20–29)
Calcium: 9.9 mg/dL (ref 8.7–10.3)
Chloride: 101 mmol/L (ref 96–106)
Creatinine, Ser: 1 mg/dL (ref 0.57–1.00)
Glucose: 163 mg/dL — ABNORMAL HIGH (ref 70–99)
Potassium: 4 mmol/L (ref 3.5–5.2)
Sodium: 140 mmol/L (ref 134–144)
eGFR: 55 mL/min/{1.73_m2} — ABNORMAL LOW (ref >59)

## 2024-04-09 LAB — CBC WITH DIFFERENTIAL/PLATELET
Basophils Absolute: 0 10*3/uL (ref 0.0–0.2)
Basos: 1 %
EOS (ABSOLUTE): 0.1 10*3/uL (ref 0.0–0.4)
Eos: 1 %
Hematocrit: 33.9 % — ABNORMAL LOW (ref 34.0–46.6)
Hemoglobin: 11.9 g/dL (ref 11.1–15.9)
Immature Grans (Abs): 0 10*3/uL (ref 0.0–0.1)
Immature Granulocytes: 1 %
Lymphocytes Absolute: 3.2 10*3/uL — ABNORMAL HIGH (ref 0.7–3.1)
Lymphs: 52 %
MCH: 33.3 pg — ABNORMAL HIGH (ref 26.6–33.0)
MCHC: 35.1 g/dL (ref 31.5–35.7)
MCV: 95 fL (ref 79–97)
Monocytes Absolute: 0.3 10*3/uL (ref 0.1–0.9)
Monocytes: 5 %
Neutrophils Absolute: 2.4 10*3/uL (ref 1.4–7.0)
Neutrophils: 40 %
Platelets: 307 10*3/uL (ref 150–450)
RBC: 3.57 x10E6/uL — ABNORMAL LOW (ref 3.77–5.28)
RDW: 14.1 % (ref 11.7–15.4)
WBC: 6 10*3/uL (ref 3.4–10.8)

## 2024-04-13 ENCOUNTER — Ambulatory Visit: Payer: Medicare PPO | Admitting: Family Medicine

## 2024-04-13 VITALS — BP 132/82 | HR 77 | Temp 98.6°F | Ht 67.0 in | Wt 165.2 lb

## 2024-04-13 DIAGNOSIS — D649 Anemia, unspecified: Secondary | ICD-10-CM

## 2024-04-13 DIAGNOSIS — I10 Essential (primary) hypertension: Secondary | ICD-10-CM | POA: Diagnosis not present

## 2024-04-13 DIAGNOSIS — N289 Disorder of kidney and ureter, unspecified: Secondary | ICD-10-CM

## 2024-04-13 DIAGNOSIS — E7849 Other hyperlipidemia: Secondary | ICD-10-CM | POA: Diagnosis not present

## 2024-04-13 MED ORDER — PRAVASTATIN SODIUM 20 MG PO TABS: 20.0000 mg | ORAL_TABLET | Freq: Every day | ORAL | 1 refills | Status: DC

## 2024-04-13 MED ORDER — AMLODIPINE BESYLATE 5 MG PO TABS: 5.0000 mg | ORAL_TABLET | Freq: Every day | ORAL | 1 refills | Status: DC

## 2024-04-13 MED ORDER — ALLOPURINOL 100 MG PO TABS: 100.0000 mg | ORAL_TABLET | Freq: Every day | ORAL | 1 refills | Status: DC

## 2024-04-13 NOTE — Addendum Note (Signed)
 Addended byHugo Maes on: 04/13/2024 01:50 PM   Modules accepted: Orders

## 2024-04-13 NOTE — Progress Notes (Signed)
   Subjective:    Patient ID: Kristi Grant, female    DOB: 13-Apr-1934, 89 y.o.   MRN: [id number]  HPI Pt comes in today for 6 month follow-up with no questions or concerns at this time. Pt has at home BP readings with her today. Medications and allergies reviewed.   Discussed the use of AI scribe software for clinical note transcription with the patient, who gave verbal consent to proceed.  History of Present Illness   Kristi Grant is an 89 year old female who presents for follow-up after hospitalization for influenza with a residual cough.  She has a residual cough following a recent hospitalization for influenza A, despite having received the flu vaccine. Her energy levels have returned to baseline, and she does not experience shortness of breath during activities.  She has resumed walking in the early mornings and has not experienced any recent episodes of hypotension or 'washout spells' that she had last year during the heat. Her blood pressure readings have been stable since her medication was increased to 5 mg, with recent readings of 116/63, 132/67, and 126/68. She follows proper technique when measuring her blood pressure.  She has regular bowel movements without blood and no recent falls or injuries. She continues to drive locally and recently drove to [facility] for an eye exam, which went well. However, her children do not allow her to drive to [place] due to traffic concerns.  She manages her meals independently and maintains social interactions with friends. She reports no particular worries or concerns at this time.  Her hemoglobin levels have improved since hospitalization, currently at 11.1. Her cholesterol levels are stable, with a bad cholesterol of 118 and good cholesterol of 63. Liver enzymes and kidney function are within acceptable ranges for her age.      Review of Systems     Objective:   Physical Exam General-in no acute distress Eyes-no  discharge Lungs-respiratory rate normal, CTA CV-no murmurs,RRR Extremities skin warm dry no edema Neuro grossly normal Behavior normal, alert        Assessment & Plan:  Assessment and Plan    Residual cough post-influenza Residual cough post-influenza. Breathing stable, energy levels normal. - Report any worsening or new symptoms.  Hypertension Blood pressure controlled on amlodipine  5 mg. No hypotension or dizziness. Discussed orthostatic dizziness prevention. - Continue amlodipine  5 mg. - Monitor blood pressure regularly.  Hyperlipidemia Cholesterol stable on current medication. LDL slightly elevated, HDL favorable. Current regimen effective with minimal side effects. - Continue current cholesterol medication at 20 mg. - Refill prescription to [facility] pharmacy.  Anemia Hemoglobin improved to 11.1 g/dL. Bone marrow function age-appropriate. No concerns for blood loss or transfusion. - Recheck hemoglobin levels in 6 months.      1. Essential hypertension, benign (Primary) Blood pressure under good control continue current measures  2. Other hyperlipidemia Continue statin does not tolerate other dosing overall decent control  3. Normochromic anemia Hemoglobin is improving we will recheck it again in 6 months more likely related to age and chronic health issues but will monitor  4. Renal insufficiency Encourage healthy eating fluid intake recheck this again in the fall with urine ACR

## 2024-05-03 DIAGNOSIS — J449 Chronic obstructive pulmonary disease, unspecified: Secondary | ICD-10-CM | POA: Diagnosis not present

## 2024-05-14 DIAGNOSIS — L570 Actinic keratosis: Secondary | ICD-10-CM | POA: Diagnosis not present

## 2024-05-14 DIAGNOSIS — Z08 Encounter for follow-up examination after completed treatment for malignant neoplasm: Secondary | ICD-10-CM | POA: Diagnosis not present

## 2024-05-14 DIAGNOSIS — L814 Other melanin hyperpigmentation: Secondary | ICD-10-CM | POA: Diagnosis not present

## 2024-05-14 DIAGNOSIS — L218 Other seborrheic dermatitis: Secondary | ICD-10-CM | POA: Diagnosis not present

## 2024-05-14 DIAGNOSIS — Z85828 Personal history of other malignant neoplasm of skin: Secondary | ICD-10-CM | POA: Diagnosis not present

## 2024-05-14 DIAGNOSIS — L821 Other seborrheic keratosis: Secondary | ICD-10-CM | POA: Diagnosis not present

## 2024-05-14 DIAGNOSIS — D225 Melanocytic nevi of trunk: Secondary | ICD-10-CM | POA: Diagnosis not present

## 2024-06-03 DIAGNOSIS — J449 Chronic obstructive pulmonary disease, unspecified: Secondary | ICD-10-CM | POA: Diagnosis not present

## 2024-06-22 DIAGNOSIS — H903 Sensorineural hearing loss, bilateral: Secondary | ICD-10-CM | POA: Diagnosis not present

## 2024-07-03 DIAGNOSIS — J449 Chronic obstructive pulmonary disease, unspecified: Secondary | ICD-10-CM | POA: Diagnosis not present

## 2024-07-17 ENCOUNTER — Ambulatory Visit: Payer: Medicare PPO

## 2024-07-24 ENCOUNTER — Ambulatory Visit

## 2024-07-24 ENCOUNTER — Telehealth: Payer: Self-pay

## 2024-07-24 ENCOUNTER — Other Ambulatory Visit: Payer: Self-pay | Admitting: Family Medicine

## 2024-07-24 VITALS — Ht 67.0 in | Wt 165.0 lb

## 2024-07-24 DIAGNOSIS — Z0000 Encounter for general adult medical examination without abnormal findings: Secondary | ICD-10-CM

## 2024-07-24 MED ORDER — AMLODIPINE BESYLATE 2.5 MG PO TABS: 2.5000 mg | ORAL_TABLET | Freq: Every day | ORAL | 4 refills | Status: DC

## 2024-07-24 NOTE — Patient Instructions (Signed)
 Ms. Threat , Thank you for taking time out of your busy schedule to complete your Annual Wellness Visit with me. I enjoyed our conversation and look forward to speaking with you again next year. I, as well as your care team,  appreciate your ongoing commitment to your health goals. Please review the following plan we discussed and let me know if I can assist you in the future. Your Game plan/ To Do List   Follow up Visits: We will see or speak with you next year for your Next Medicare AWV with our clinical staff Have you seen your provider in the last 6 months (3 months if uncontrolled diabetes)? Yes  Clinician Recommendations:  Aim for 30 minutes of exercise or brisk walking, 6-8 glasses of water, and 5 servings of fruits and vegetables each day.       This is a list of the screenings recommended for you:  Health Maintenance  Topic Date Due   COVID-19 Vaccine (4 - 2024-25 season) 08/25/2023   Flu Shot  07/24/2024   Medicare Annual Wellness Visit  07/24/2025   DTaP/Tdap/Td vaccine (2 - Td or Tdap) 07/28/2030   Pneumococcal Vaccine for age over 91  Completed   DEXA scan (bone density measurement)  Completed   Zoster (Shingles) Vaccine  Completed   Hepatitis B Vaccine  Aged Out   HPV Vaccine  Aged Out   Meningitis B Vaccine  Aged Out    Advanced directives: (ACP Link)Information on Advanced Care Planning can be found at [place]  Best boy Advance Health Care Directives Advance Health Care Directives. [email or web address]   Advance Care Planning is important because it:  [x]  Makes sure you receive the medical care that is consistent with your values, goals, and preferences  [x]  It provides guidance to your family and loved ones and reduces their decisional burden about whether or not they are making the right decisions based on your wishes.  Follow the link provided in your after visit summary or read over the paperwork we have mailed to you to help you started getting your Advance  Directives in place. If you need assistance in completing these, please reach out to us  so that we can help you!  See attachments for Preventive Care and Fall Prevention Tips.

## 2024-07-24 NOTE — Progress Notes (Signed)
 Subjective:   Kristi Grant is a 89 y.o. who presents for a Medicare Wellness preventive visit.  As a reminder, Annual Wellness Visits don't include a physical exam, and some assessments may be limited, especially if this visit is performed virtually. We may recommend an in-person follow-up visit with your provider if needed.  Visit Complete: Virtual I connected with  Kristi Grant on 07/24/24 by a audio enabled telemedicine application and verified that I am speaking with the correct person using two identifiers.  Patient Location: Home  Provider Location: Home Office  I discussed the limitations of evaluation and management by telemedicine. The patient expressed understanding and agreed to proceed.  Vital Signs: Because this visit was a virtual/telehealth visit, some criteria may be missing or patient reported. Any vitals not documented were not able to be obtained and vitals that have been documented are patient reported.  VideoDeclined- This patient declined Librarian, academic. Therefore the visit was completed with audio only.  Persons Participating in Visit: Patient.  AWV Questionnaire: No: Patient Medicare AWV questionnaire was not completed prior to this visit.  Cardiac Risk Factors include: advanced age (>58men, >54 women);dyslipidemia;hypertension     Objective:    Today's Vitals   07/24/24 0834  Weight: 165 lb (74.8 kg)  Height: 5' 7 (1.702 m)   Body mass index is 25.84 kg/m.     07/24/2024    8:41 AM 01/29/2024    2:48 PM 01/29/2024   12:18 PM 07/12/2023   11:29 AM 07/03/2022    8:20 AM 06/13/2021   11:11 AM 01/28/2015    8:17 AM  Advanced Directives  Does Patient Have a Medical Advance Directive? No Yes No No Yes Yes Yes   Type of Furniture conservator/restorer;Living will   Healthcare Power of [place];Living will Healthcare Power of [place];Living will   Does patient want to make changes to medical advance directive?   No - Patient declined    No - Patient declined   Copy of Healthcare Power of Attorney in Chart?  No - copy requested   No - copy requested No - copy requested No - copy requested   Would patient like information on creating a medical advance directive? Yes (MAU/Ambulatory/Procedural Areas - Information given) No - Patient declined No - Patient declined No - Patient declined  No - Patient declined      Data saved with a previous flowsheet row definition    Current Medications (verified) Outpatient Encounter Medications as of 07/24/2024  Medication Sig   allopurinol  (ZYLOPRIM ) 100 MG tablet Take 1 tablet (100 mg total) by mouth daily.   amLODipine  (NORVASC ) 5 MG tablet Take 1 tablet (5 mg total) by mouth daily. For BP   cholecalciferol  (VITAMIN D3) 25 MCG (1000 UNIT) tablet Take 1,000 Units by mouth daily.   Coenzyme Q10 (COQ-10) 100 MG CAPS Take by mouth daily.   Methylcobalamin (B12-ACTIVE PO) Take by mouth.   pravastatin  (PRAVACHOL ) 20 MG tablet Take 1 tablet (20 mg total) by mouth daily.   albuterol  (PROVENTIL ) (2.5 MG/3ML) 0.083% nebulizer solution Take 3 mLs (2.5 mg total) by nebulization every 4 (four) hours as needed for wheezing or shortness of breath. (Patient not taking: Reported on 07/24/2024)   albuterol  (VENTOLIN  HFA) 108 (90 Base) MCG/ACT inhaler Inhale 2 puffs into the lungs every 2 (two) hours as needed for wheezing or shortness of breath. (Patient not taking: Reported on 07/24/2024)   No facility-administered encounter medications on  file as of 07/24/2024.    Allergies (verified) Clindamycin/lincomycin, Ciprofloxacin , Flagyl  [metronidazole ], Hydrocodone, Iohexol, Nucynta [tapentadol], and Rocephin  [ceftriaxone  sodium in dextrose]   History: Past Medical History:  Diagnosis Date   Arthritis    Blood transfusion without reported diagnosis    Cataract    Hypertension    Neuromuscular disorder [facility])    Past Surgical History:  Procedure Laterality Date   ABDOMINAL HYSTERECTOMY      APPENDECTOMY  1963   BASAL CELL CARCINOMA EXCISION  01/2011   nose   CATARACT EXTRACTION     EYE SURGERY  2017   JOINT REPLACEMENT  June 22, 2010   Ovarian wedge resection  1962   REPLACEMENT TOTAL KNEE Left 05/2010   VAGINAL HYSTERECTOMY  1990   Family History  Problem Relation Age of Onset   Heart disease Mother    Heart disease Father    Breast cancer Sister    Cirrhosis Sister    Diabetes Sister    Breast cancer Maternal Grandmother    Ovarian cancer Maternal Grandmother    Diabetes Sister    Social History   Socioeconomic History   Marital status: Married    Spouse name: Not on file   Number of children: Not on file   Years of education: Not on file   Highest education level: Not on file  Occupational History   Not on file  Tobacco Use   Smoking status: Never   Smokeless tobacco: Never  Vaping Use   Vaping status: Never Used  Substance and Sexual Activity   Alcohol use: No   Drug use: No   Sexual activity: Not Currently  Other Topics Concern   Not on file  Social History Narrative   Not on file   Social Drivers of Health   Financial Resource Strain: Low Risk  (07/24/2024)   Overall Financial Resource Strain (CARDIA)    Difficulty of Paying Living Expenses: Not hard at all  Food Insecurity: No Food Insecurity (07/24/2024)   Hunger Vital Sign    Worried About Running Out of Food in the Last Year: Never true    Ran Out of Food in the Last Year: Never true  Transportation Needs: No Transportation Needs (07/24/2024)   PRAPARE - Administrator, Civil Service (Medical): No    Lack of Transportation (Non-Medical): No  Physical Activity: Sufficiently Active (07/24/2024)   Exercise Vital Sign    Days of Exercise per Week: 7 days    Minutes of Exercise per Session: 30 min  Stress: No Stress Concern Present (07/24/2024)   [employer] of Occupational Health - Occupational Stress Questionnaire    Feeling of Stress: Not at all  Social Connections:  Moderately Integrated (07/24/2024)   Social Connection and Isolation Panel    Frequency of Communication with Friends and Family: More than three times a week    Frequency of Social Gatherings with Friends and Family: More than three times a week    Attends Religious Services: More than 4 times per year    Active Member of [employer] or Organizations: Yes    Attends Banker Meetings: More than 4 times per year    Marital Status: Widowed    Tobacco Counseling Counseling given: Not Answered    Clinical Intake:  Pre-visit preparation completed: Yes  Pain : No/denies pain     Diabetes: No  No results found for: HGBA1C   How often do you need to have someone help you when you  read instructions, pamphlets, or other written materials from your doctor or pharmacy?: 1 - Never  Interpreter Needed?: No  Information entered by :: Charmaine Bloodgood LPN   Activities of Daily Living     07/24/2024    8:41 AM 01/29/2024    2:48 PM  In your present state of health, do you have any difficulty performing the following activities:  Hearing? 0 0  Vision? 0 0  Difficulty concentrating or making decisions? 0 0  Walking or climbing stairs? 0   Dressing or bathing? 0   Doing errands, shopping? 0 0  Preparing Food and eating ? N   Using the Toilet? N   In the past six months, have you accidently leaked urine? N   Do you have problems with loss of bowel control? N   Managing your Medications? N   Managing your Finances? N   Housekeeping or managing your Housekeeping? N     Patient Care Team: Alphonsa Glendia LABOR, MD as PCP - General (Family Medicine) Alvan Dorn FALCON, MD as PCP - Cardiology (Cardiology) Leslee Reusing, MD as Consulting Physician (Ophthalmology) Dietrich Alyce BROCKS, MD as Referring Physician (Dermatology)  I have updated your Care Teams any recent Medical Services you may have received from other providers in the past year.     Assessment:   This is a routine  wellness examination for [facility].  Hearing/Vision screen Hearing Screening - Comments:: Some hearing difficulties; wears hearing aids and followed by ENT (new hearing aids 2025) Vision Screening - Comments::  up to date with routine eye exams with Dr. Leslee    Goals Addressed             This Visit's Progress    Patient Stated   On track    I would like to stay active as long as I can      Prevent falls   On track      Depression Screen     07/24/2024    8:39 AM 04/13/2024   10:53 AM 02/20/2024    9:06 AM 10/14/2023   10:59 AM 07/12/2023   11:34 AM 04/19/2023   10:43 AM 01/18/2023   10:18 AM  PHQ 2/9 Scores  PHQ - 2 Score 0 0 0 0 0 0 0  PHQ- 9 Score  0 0 0  0 0    Fall Risk     07/24/2024    8:40 AM 04/13/2024   10:53 AM 02/20/2024    9:06 AM 10/14/2023   10:58 AM 07/12/2023   11:37 AM  Fall Risk   Falls in the past year? 0 0 0 0 0  Number falls in past yr: 0   0 0  Injury with Fall? 0   0 0  Risk for fall due to : No Fall Risks    No Fall Risks  Follow up Falls prevention discussed;Education provided;Falls evaluation completed    Falls prevention discussed    MEDICARE RISK AT HOME:  Medicare Risk at Home Any stairs in or around the home?: No If so, are there any without handrails?: No Home free of loose throw rugs in walkways, pet beds, electrical cords, etc?: Yes Adequate lighting in your home to reduce risk of falls?: Yes Life alert?: No Use of a cane, walker or w/c?: No Grab bars in the bathroom?: Yes Shower chair or bench in shower?: No Elevated toilet seat or a handicapped toilet?: Yes  TIMED UP AND GO:  Was the test performed?  No  Cognitive Function: Declined/Normal: No cognitive concerns noted by patient or family. Patient alert, oriented, able to answer questions appropriately and recall recent events. No signs of memory loss or confusion.        07/12/2023   11:37 AM 07/03/2022    8:23 AM  6CIT Screen  What Year? 0 points 0 points  What month? 0  points 0 points  What time? 0 points 0 points  Count back from 20 0 points 0 points  Months in reverse 0 points 0 points  Repeat phrase 0 points 0 points  Total Score 0 points 0 points    Immunizations Immunization History  Administered Date(s) Administered   Fluad Quad(high Dose 65+) 10/20/2020, 09/15/2021, 09/07/2022   Fluad Trivalent(High Dose 65+) 10/14/2023   Influenza, High Dose Seasonal PF 10/03/2018, 09/23/2019   Influenza-Unspecified 10/01/2013, 10/03/2015, 10/23/2017, 10/02/2018, 09/23/2019   Moderna SARS-COV2 Booster Vaccination 03/21/2021   Moderna Sars-Covid-2 Vaccination 01/28/2020, 02/26/2020, 08/31/2020   PNEUMOCOCCAL CONJUGATE-20 10/14/2023   Pneumococcal Conjugate-13 07/01/2014   Pneumococcal-Unspecified 09/23/2010   Tdap 07/28/2020   Zoster Recombinant(Shingrix) 10/16/2017, 03/18/2018   Zoster, Live 12/25/2007    Screening Tests Health Maintenance  Topic Date Due   COVID-19 Vaccine (4 - 2024-25 season) 08/25/2023   INFLUENZA VACCINE  07/24/2024   Medicare Annual Wellness (AWV)  07/24/2025   DTaP/Tdap/Td (2 - Td or Tdap) 07/28/2030   Pneumococcal Vaccine: 50+ Years  Completed   DEXA SCAN  Completed   Zoster Vaccines- Shingrix  Completed   Hepatitis B Vaccines  Aged Out   HPV VACCINES  Aged Out   Meningococcal B Vaccine  Aged Out    Health Maintenance  Health Maintenance Due  Topic Date Due   COVID-19 Vaccine (4 - 2024-25 season) 08/25/2023   INFLUENZA VACCINE  07/24/2024   Health Maintenance Items Addressed: Patient would like to discuss dexa with pcp at next office visit   Additional Screening:  Vision Screening: Recommended annual ophthalmology exams for early detection of glaucoma and other disorders of the eye. Would you like a referral to an eye doctor? No    Dental Screening: Recommended annual dental exams for proper oral hygiene  Community Resource Referral / Chronic Care Management: CRR required this visit?  No   CCM required this  visit?  No   Plan:    I have personally reviewed and noted the following in the patient's chart:   Medical and social history Use of alcohol, tobacco or illicit drugs  Current medications and supplements including opioid prescriptions. Patient is not currently taking opioid prescriptions. Functional ability and status Nutritional status Physical activity Advanced directives List of other physicians Hospitalizations, surgeries, and ER visits in previous 12 months Vitals Screenings to include cognitive, depression, and falls Referrals and appointments  In addition, I have reviewed and discussed with patient certain preventive protocols, quality metrics, and best practice recommendations. A written personalized care plan for preventive services as well as general preventive health recommendations were provided to patient.   Lavelle Pfeiffer [place], [place]   12/31/7972   After Visit Summary: (MyChart) Due to this being a telephonic visit, the after visit summary with patients personalized plan was offered to patient via MyChart   Notes: See telephone note

## 2024-07-24 NOTE — Telephone Encounter (Signed)
 Prescription sent to [facility] pharmacy please notify patient, amlodipine  2.5 mg new dose 1 daily Keep follow-up visit as planned later this fall notify us  sooner if any problems

## 2024-07-24 NOTE — Telephone Encounter (Signed)
 Patient seen for AWV.  States that she monitors blood pressure at home and has been getting low readings with systolic sometimes being in the 90s and diastolic in the 50s.  She cut Amlodipine  back to 2.5mg  and has seen improvement.  She would like to know if rx can be sent in for Amlodipine  2.5mg  for 30 days to last until she comes in for appt on 09/23/24

## 2024-07-27 NOTE — Telephone Encounter (Signed)
 Pt.notified

## 2024-08-03 DIAGNOSIS — J449 Chronic obstructive pulmonary disease, unspecified: Secondary | ICD-10-CM | POA: Diagnosis not present

## 2024-09-03 DIAGNOSIS — J449 Chronic obstructive pulmonary disease, unspecified: Secondary | ICD-10-CM | POA: Diagnosis not present

## 2024-09-15 DIAGNOSIS — D649 Anemia, unspecified: Secondary | ICD-10-CM | POA: Diagnosis not present

## 2024-09-15 DIAGNOSIS — E7849 Other hyperlipidemia: Secondary | ICD-10-CM | POA: Diagnosis not present

## 2024-09-15 DIAGNOSIS — I10 Essential (primary) hypertension: Secondary | ICD-10-CM | POA: Diagnosis not present

## 2024-09-16 LAB — LIPID PANEL

## 2024-09-18 ENCOUNTER — Ambulatory Visit: Payer: Self-pay | Admitting: Family Medicine

## 2024-09-18 LAB — BASIC METABOLIC PANEL WITH GFR
BUN/Creatinine Ratio: 19 (ref 12–28)
BUN: 19 mg/dL (ref 8–27)
CO2: 22 mmol/L (ref 20–29)
Calcium: 9.7 mg/dL (ref 8.7–10.3)
Chloride: 102 mmol/L (ref 96–106)
Creatinine, Ser: 0.99 mg/dL (ref 0.57–1.00)
Glucose: 133 mg/dL — AB (ref 70–99)
Potassium: 4 mmol/L (ref 3.5–5.2)
Sodium: 140 mmol/L (ref 134–144)
eGFR: 55 mL/min/1.73 — AB (ref >59)

## 2024-09-18 LAB — CBC WITH DIFFERENTIAL/PLATELET
Basophils Absolute: 0 x10E3/uL (ref 0.0–0.2)
Basos: 0 %
EOS (ABSOLUTE): 0 x10E3/uL (ref 0.0–0.4)
Eos: 1 %
Hematocrit: 35 % (ref 34.0–46.6)
Hemoglobin: 11 g/dL — ABNORMAL LOW (ref 11.1–15.9)
Immature Grans (Abs): 0 x10E3/uL (ref 0.0–0.1)
Immature Granulocytes: 0 %
Lymphocytes Absolute: 2.1 x10E3/uL (ref 0.7–3.1)
Lymphs: 57 %
MCH: 31.1 pg (ref 26.6–33.0)
MCHC: 31.4 g/dL — ABNORMAL LOW (ref 31.5–35.7)
MCV: 99 fL — ABNORMAL HIGH (ref 79–97)
Monocytes Absolute: 0.3 x10E3/uL (ref 0.1–0.9)
Monocytes: 8 %
Neutrophils Absolute: 1.3 x10E3/uL — ABNORMAL LOW (ref 1.4–7.0)
Neutrophils: 34 %
Platelets: 233 x10E3/uL (ref 150–450)
RBC: 3.54 x10E6/uL — ABNORMAL LOW (ref 3.77–5.28)
RDW: 13.6 % (ref 11.7–15.4)
WBC: 3.8 x10E3/uL (ref 3.4–10.8)

## 2024-09-18 LAB — LIPID PANEL
Cholesterol, Total: 196 mg/dL (ref 100–199)
HDL: 60 mg/dL (ref >39)
LDL CALC COMMENT:: 3.3 ratio (ref 0.0–4.4)
LDL Chol Calc (NIH): 121 mg/dL — AB (ref 0–99)
Triglycerides: 81 mg/dL (ref 0–149)
VLDL Cholesterol Cal: 15 mg/dL (ref 5–40)

## 2024-09-18 LAB — MICROALBUMIN / CREATININE URINE RATIO

## 2024-09-23 ENCOUNTER — Ambulatory Visit: Admitting: Family Medicine

## 2024-09-23 ENCOUNTER — Encounter: Payer: Self-pay | Admitting: Family Medicine

## 2024-09-23 VITALS — BP 146/84 | HR 78 | Temp 98.5°F | Ht 67.0 in | Wt 164.0 lb

## 2024-09-23 DIAGNOSIS — Z8739 Personal history of other diseases of the musculoskeletal system and connective tissue: Secondary | ICD-10-CM | POA: Diagnosis not present

## 2024-09-23 DIAGNOSIS — I10 Essential (primary) hypertension: Secondary | ICD-10-CM | POA: Diagnosis not present

## 2024-09-23 DIAGNOSIS — R739 Hyperglycemia, unspecified: Secondary | ICD-10-CM | POA: Diagnosis not present

## 2024-09-23 DIAGNOSIS — Z23 Encounter for immunization: Secondary | ICD-10-CM | POA: Diagnosis not present

## 2024-09-23 DIAGNOSIS — E7849 Other hyperlipidemia: Secondary | ICD-10-CM

## 2024-09-23 DIAGNOSIS — D539 Nutritional anemia, unspecified: Secondary | ICD-10-CM

## 2024-09-23 MED ORDER — PRAVASTATIN SODIUM 20 MG PO TABS: 20.0000 mg | ORAL_TABLET | Freq: Every day | ORAL | 1 refills | Status: AC

## 2024-09-23 MED ORDER — ALLOPURINOL 100 MG PO TABS: 100.0000 mg | ORAL_TABLET | Freq: Every day | ORAL | 1 refills | Status: AC

## 2024-09-23 MED ORDER — AMLODIPINE BESYLATE 2.5 MG PO TABS: 2.5000 mg | ORAL_TABLET | Freq: Every day | ORAL | 2 refills | Status: AC

## 2024-09-23 NOTE — Progress Notes (Signed)
 Subjective:    Patient ID: Kristi Grant, female    DOB: 08-20-1934, 89 y.o.   MRN: [id number]  HPI HTN, hyperlipdemia  Patient reported changes to amlodipine  was prescribed 10 mg , then changed to 5 mg at pcp follow up, called nurse line at [facility] And was advised to go down to 2.5 mg due to low readings  Patient relates at times her blood pressure is low other times it is high she denies any chest pain short ness of breath She had recent lab work that showed macrocytic anemia raises a question of B12 folate issues nutritional issues she relates she is eating well it is possible she is not absorbing Cholesterol profile kidney function CBC reviewed  Results for orders placed or performed in visit on 04/13/24  Basic Metabolic Panel   Collection Time: 09/15/24 10:27 AM  Result Value Ref Range   Glucose 133 (H) 70 - 99 mg/dL   BUN 19 8 - 27 mg/dL   Creatinine, Ser 9.00 0.57 - 1.00 mg/dL   eGFR 55 (L) >40 fO/fpw/8.26   BUN/Creatinine Ratio 19 12 - 28   Sodium 140 134 - 144 mmol/L   Potassium 4.0 3.5 - 5.2 mmol/L   Chloride 102 96 - 106 mmol/L   CO2 22 20 - 29 mmol/L   Calcium 9.7 8.7 - 10.3 mg/dL  Lipid Panel   Collection Time: 09/15/24 10:27 AM  Result Value Ref Range   Cholesterol, Total 196 100 - 199 mg/dL   Triglycerides 81 0 - 149 mg/dL   HDL 60 >60 mg/dL   VLDL Cholesterol Cal 15 5 - 40 mg/dL   LDL Chol Calc (NIH) 878 (H) 0 - 99 mg/dL   Chol/HDL Ratio 3.3 0.0 - 4.4 ratio  CBC with Differential   Collection Time: 09/15/24 10:27 AM  Result Value Ref Range   WBC 3.8 3.4 - 10.8 x10E3/uL   RBC 3.54 (L) 3.77 - 5.28 x10E6/uL   Hemoglobin 11.0 (L) 11.1 - 15.9 g/dL   Hematocrit 64.9 65.9 - 46.6 %   MCV 99 (H) 79 - 97 fL   MCH 31.1 26.6 - 33.0 pg   MCHC 31.4 (L) 31.5 - 35.7 g/dL   RDW 86.3 88.2 - 84.5 %   Platelets 233 150 - 450 x10E3/uL   Neutrophils 34 Not Estab. %   Lymphs 57 Not Estab. %   Monocytes 8 Not Estab. %   Eos 1 Not Estab. %   Basos 0 Not Estab. %    Neutrophils Absolute 1.3 (L) 1.4 - 7.0 x10E3/uL   Lymphocytes Absolute 2.1 0.7 - 3.1 x10E3/uL   Monocytes Absolute 0.3 0.1 - 0.9 x10E3/uL   EOS (ABSOLUTE) 0.0 0.0 - 0.4 x10E3/uL   Basophils Absolute 0.0 0.0 - 0.2 x10E3/uL   Immature Granulocytes 0 Not Estab. %   Immature Grans (Abs) 0.0 0.0 - 0.1 x10E3/uL  Microalbumin/Creatinine Ratio, Urine   Collection Time: 09/15/24 10:27 AM  Result Value Ref Range   Creatinine, Urine CANCELED mg/dL   Microalbumin, Urine CANCELED ug/mL   Microalb/Creat Ratio Comment 0 - 29 mg/g creat    Review of Systems     Objective:   Physical Exam General-in no acute distress Eyes-no discharge Lungs-respiratory rate normal, CTA CV-no murmurs,RRR Extremities skin warm dry no edema Neuro grossly normal Behavior normal, alert        Assessment & Plan:  1. Macrocytic anemia (Primary) Because of recent lab work check B12 folate MCV is elevated hemoglobin down -  B12 and Folate Panel  2. Essential hypertension, benign Blood pressure relatively low-but at the same time other times it is high because of this labile nature I would recommend sticking with the 2.5  3. Other hyperlipidemia LDL reasonable I do not see a need to try to push it under 100 she is tolerating the current dose we will stay with that  4. History of gout No flareup recently check uric acid continue medicine - Uric acid  5. Hyperglycemia Recent lab work shows hyperglycemia check A1c await results - HgB A1c She was not able to do urine creatinine but we will hold off on this currently pick this up on a future visit Follow-up 4 months

## 2024-09-24 LAB — B12 AND FOLATE PANEL
Folate: 11.8 ng/mL (ref >3.0)
Vitamin B-12: 1104 pg/mL (ref 232–1245)

## 2024-09-24 LAB — HEMOGLOBIN A1C
Est. average glucose Bld gHb Est-mCnc: 117 mg/dL
Hgb A1c MFr Bld: 5.7 % — ABNORMAL HIGH (ref 4.8–5.6)

## 2024-09-24 LAB — URIC ACID: Uric Acid: 5.3 mg/dL (ref 3.1–7.9)

## 2024-09-27 ENCOUNTER — Ambulatory Visit: Payer: Self-pay | Admitting: Family Medicine

## 2024-09-27 DIAGNOSIS — D539 Nutritional anemia, unspecified: Secondary | ICD-10-CM

## 2024-09-27 DIAGNOSIS — I10 Essential (primary) hypertension: Secondary | ICD-10-CM

## 2024-09-27 DIAGNOSIS — E7849 Other hyperlipidemia: Secondary | ICD-10-CM

## 2024-10-03 DIAGNOSIS — J449 Chronic obstructive pulmonary disease, unspecified: Secondary | ICD-10-CM | POA: Diagnosis not present

## 2024-11-03 DIAGNOSIS — J449 Chronic obstructive pulmonary disease, unspecified: Secondary | ICD-10-CM | POA: Diagnosis not present

## 2024-11-24 DIAGNOSIS — Z08 Encounter for follow-up examination after completed treatment for malignant neoplasm: Secondary | ICD-10-CM | POA: Diagnosis not present

## 2024-11-24 DIAGNOSIS — L814 Other melanin hyperpigmentation: Secondary | ICD-10-CM | POA: Diagnosis not present

## 2024-11-24 DIAGNOSIS — D1801 Hemangioma of skin and subcutaneous tissue: Secondary | ICD-10-CM | POA: Diagnosis not present

## 2024-11-24 DIAGNOSIS — L821 Other seborrheic keratosis: Secondary | ICD-10-CM | POA: Diagnosis not present

## 2024-11-24 DIAGNOSIS — Z85828 Personal history of other malignant neoplasm of skin: Secondary | ICD-10-CM | POA: Diagnosis not present

## 2025-03-24 ENCOUNTER — Ambulatory Visit: Admitting: Family Medicine

## 2025-03-30 ENCOUNTER — Ambulatory Visit: Admitting: Family Medicine

## 2025-07-30 ENCOUNTER — Ambulatory Visit
